# Patient Record
Sex: Male | Born: 1960 | Hispanic: Yes | Marital: Married | Smoking: Current some day smoker
Health system: Southern US, Community
[De-identification: ages and names within clinical notes are randomized; demographics above are authoritative.]

## PROBLEM LIST (undated history)

## (undated) DIAGNOSIS — D696 Thrombocytopenia, unspecified: Secondary | ICD-10-CM

## (undated) DIAGNOSIS — K7460 Unspecified cirrhosis of liver: Secondary | ICD-10-CM

---

## 2016-02-29 ENCOUNTER — Encounter: Payer: Self-pay | Admitting: Emergency Medicine

## 2016-02-29 ENCOUNTER — Emergency Department
Admission: EM | Admit: 2016-02-29 | Discharge: 2016-03-01 | Disposition: A | Discharge status: Home / Self Care | Payer: Self-pay | Attending: Emergency Medicine | Admitting: Emergency Medicine

## 2016-02-29 DIAGNOSIS — F17210 Nicotine dependence, cigarettes, uncomplicated: Secondary | ICD-10-CM | POA: Insufficient documentation

## 2016-02-29 DIAGNOSIS — F10920 Alcohol use, unspecified with intoxication, uncomplicated: Secondary | ICD-10-CM

## 2016-02-29 DIAGNOSIS — F1010 Alcohol abuse, uncomplicated: Secondary | ICD-10-CM | POA: Insufficient documentation

## 2016-02-29 DIAGNOSIS — R109 Unspecified abdominal pain: Secondary | ICD-10-CM | POA: Insufficient documentation

## 2016-02-29 DIAGNOSIS — K7030 Alcoholic cirrhosis of liver without ascites: Secondary | ICD-10-CM | POA: Insufficient documentation

## 2016-02-29 LAB — COMPREHENSIVE METABOLIC PANEL
ALBUMIN: 3.6 g/dL (ref 3.5–5.0)
ALT: 29 U/L (ref 17–63)
ANION GAP: 9 (ref 5–15)
AST: 88 U/L — ABNORMAL HIGH (ref 15–41)
Alkaline Phosphatase: 146 U/L — ABNORMAL HIGH (ref 38–126)
BILIRUBIN TOTAL: 0.8 mg/dL (ref 0.3–1.2)
BUN: 5 mg/dL — ABNORMAL LOW (ref 6–20)
CO2: 22 mmol/L (ref 22–32)
Calcium: 8.2 mg/dL — ABNORMAL LOW (ref 8.9–10.3)
Chloride: 106 mmol/L (ref 101–111)
Creatinine, Ser: 0.57 mg/dL — ABNORMAL LOW (ref 0.61–1.24)
GFR calc Af Amer: >60 mL/min (ref >60)
GFR calc non Af Amer: >60 mL/min (ref >60)
GLUCOSE: 122 mg/dL — AB (ref 65–99)
POTASSIUM: 3.5 mmol/L (ref 3.5–5.1)
SODIUM: 137 mmol/L (ref 135–145)
TOTAL PROTEIN: 8.8 g/dL — AB (ref 6.5–8.1)

## 2016-02-29 LAB — CBC
HCT: 42.3 % (ref 40.0–52.0)
Hemoglobin: 14.9 g/dL (ref 13.0–18.0)
MCH: 33.2 pg (ref 26.0–34.0)
MCHC: 35.1 g/dL (ref 32.0–36.0)
MCV: 94.5 fL (ref 80.0–100.0)
PLATELETS: 208 10*3/uL (ref 150–440)
RBC: 4.48 MIL/uL (ref 4.40–5.90)
RDW: 13.4 % (ref 11.5–14.5)
WBC: 11.4 10*3/uL — ABNORMAL HIGH (ref 3.8–10.6)

## 2016-02-29 LAB — PROTIME-INR
INR: 1.4
Prothrombin Time: 17.3 seconds — ABNORMAL HIGH (ref 11.4–15.0)

## 2016-02-29 NOTE — ED Notes (Signed)
Family reports that patient has been vomiting blood for approximately 2 hours.  Patient with bright red blood noted in mouth.  Denies any type of injury.

## 2016-03-01 ENCOUNTER — Emergency Department: Payer: Self-pay

## 2016-03-01 LAB — ETHANOL: Alcohol, Ethyl (B): 385 mg/dL

## 2016-03-01 MED ORDER — IOPAMIDOL (ISOVUE-300) INJECTION 61%
100.0000 mL | Freq: Once | INTRAVENOUS | Status: AC | PRN
  Administered 2016-03-01: 100 mL via INTRAVENOUS

## 2016-03-01 MED ORDER — DIATRIZOATE MEGLUMINE & SODIUM 66-10 % PO SOLN
15.0000 mL | Freq: Once | ORAL | Status: AC
  Administered 2016-03-01: 15 mL via ORAL

## 2016-03-01 NOTE — Discharge Instructions (Signed)
Alcohol Intoxication Alcohol intoxication occurs when the amount of alcohol that a person has consumed impairs his or her ability to mentally and physically function. Alcohol directly impairs the normal chemical activity of the brain. Drinking large amounts of alcohol can lead to changes in mental function and behavior, and it can cause many physical effects that can be harmful.  Alcohol intoxication can range in severity from mild to very severe. Various factors can affect the level of intoxication that occurs, such as the person's age, gender, weight, frequency of alcohol consumption, and the presence of other medical conditions (such as diabetes, seizures, or heart conditions). Dangerous levels of alcohol intoxication may occur when people drink large amounts of alcohol in a short period (binge drinking). Alcohol can also be especially dangerous when combined with certain prescription medicines or "recreational" drugs. SIGNS AND SYMPTOMS Some common signs and symptoms of mild alcohol intoxication include:  Loss of coordination.  Changes in mood and behavior.  Impaired judgment.  Slurred speech. As alcohol intoxication progresses to more severe levels, other signs and symptoms will appear. These may include:  Vomiting.  Confusion and impaired memory.  Slowed breathing.  Seizures.  Loss of consciousness. DIAGNOSIS  Your health care provider will take a medical history and perform a physical exam. You will be asked about the amount and type of alcohol you have consumed. Blood tests will be done to measure the concentration of alcohol in your blood. In many places, your blood alcohol level must be lower than 80 mg/dL (0.08%) to legally drive. However, many dangerous effects of alcohol can occur at much lower levels.  TREATMENT  People with alcohol intoxication often do not require treatment. Most of the effects of alcohol intoxication are temporary, and they go away as the alcohol naturally  leaves the body. Your health care provider will monitor your condition until you are stable enough to go home. Fluids are sometimes given through an IV access tube to help prevent dehydration.  HOME CARE INSTRUCTIONS  Do not drive after drinking alcohol.  Stay hydrated. Drink enough water and fluids to keep your urine clear or pale yellow. Avoid caffeine.   Only take over-the-counter or prescription medicines as directed by your health care provider.  SEEK MEDICAL CARE IF:   You have persistent vomiting.   You do not feel better after a few days.  You have frequent alcohol intoxication. Your health care provider can help determine if you should see a substance use treatment counselor. SEEK IMMEDIATE MEDICAL CARE IF:   You become shaky or tremble when you try to stop drinking.   You shake uncontrollably (seizure).   You throw up (vomit) blood. This may be bright red or may look like black coffee grounds.   You have blood in your stool. This may be bright red or may appear as a black, tarry, bad smelling stool.   You become lightheaded or faint.  MAKE SURE YOU:   Understand these instructions.  Will watch your condition.  Will get help right away if you are not doing well or get worse.   This information is not intended to replace advice given to you by your health care provider. Make sure you discuss any questions you have with your health care provider.   Document Released: 05/12/2005 Document Revised: 04/04/2013 Document Reviewed: 01/05/2013 Elsevier Interactive Patient Education 2016 [employer].  Cirrhosis Cirrhosis is long-term (chronic) liver injury. The liver is your largest internal organ, and it performs many functions. The liver  converts food into energy, removes toxic material from your blood, makes important proteins, and absorbs necessary vitamins from your diet. If you have cirrhosis, it means many of your healthy liver cells have been replaced by scar  tissue. This prevents blood from flowing through your liver, which makes it difficult for your liver to function. This scarring is not reversible, but treatment can prevent it from getting worse.  CAUSES  Hepatitis C and long-term alcohol abuse are the most common causes of cirrhosis. Other causes include:  Nonalcoholic fatty liver disease.  Hepatitis B infection.  Autoimmune hepatitis.  Diseases that cause blockage of ducts inside the liver.  Inherited liver diseases.  Reactions to certain long-term medicines.  Parasitic infections.  Long-term exposure to certain toxins. RISK FACTORS You may have a higher risk of cirrhosis if you:  Have certain hepatitis viruses.  Abuse alcohol, especially if you are male.  Are overweight.  Share needles.  Have unprotected sex with someone who has hepatitis. SYMPTOMS  You may not have any signs and symptoms at first. Symptoms may not develop until the damage to your liver starts to get worse. Signs and symptoms of cirrhosis may include:   Tenderness in the right-upper part of your abdomen.  Weakness and tiredness (fatigue).  Loss of appetite.  Nausea.  Weight loss and muscle loss.  Itchiness.  Yellow skin and eyes (jaundice).  Buildup of fluid in the abdomen (ascites).  Swelling of the feet and ankles (edema).  Appearance of tiny blood vessels under the skin.  Mental confusion.  Easy bruising and bleeding. DIAGNOSIS  Your health care provider may suspect cirrhosis based on your symptoms and medical history, especially if you have other medical conditions or a history of alcohol abuse. Your health care provider will do a physical exam to feel your liver and check for signs of cirrhosis. Your health care provider may perform other tests, including:   Blood tests to check:   Whether you have hepatitis B or C.   Kidney function.  Liver function.  Imaging tests such as:  MRI or CT scan to look for changes seen in  advanced cirrhosis.  Ultrasound to see if normal liver tissue is being replaced by scar tissue.  A procedure using a long needle to take a sample of liver tissue (biopsy) for examination under a microscope. Liver biopsy can confirm the diagnosis of cirrhosis.  TREATMENT  Treatment depends on how damaged your liver is and what caused the damage. Treatment may include treating cirrhosis symptoms or treating the underlying causes of the condition to try to slow the progression of the damage. Treatment may include:  Making lifestyle changes, such as:   Eating a healthy diet.  Restricting salt intake.  Maintaining a healthy weight.   Not abusing drugs or alcohol.  Taking medicines to:  Treat liver infections or other infections.  Control itching.  Reduce fluid buildup.  Reduce certain blood toxins.  Reduce risk of bleeding from enlarged blood vessels in the stomach or esophagus (varices).  If varices are causing bleeding problems, you may need treatment with a procedure that ties up the vessels causing them to fall off (band ligation).  If cirrhosis is causing your liver to fail, your health care provider may recommend a liver transplant.  Other treatments may be recommended depending on any complications of cirrhosis, such as liver-related kidney failure (hepatorenal syndrome). HOME CARE INSTRUCTIONS   Take medicines only as directed by your health care provider. Do not use drugs  that are toxic to your liver. Ask your health care provider before taking any new medicines, including over-the-counter medicines.   Rest as needed.  Eat a well-balanced diet. Ask your health care provider or dietitian for more information.   You may have to follow a low-salt diet or restrict your water intake as directed.  Do not drink alcohol. This is especially important if you are taking acetaminophen.  Keep all follow-up visits as directed by your health care provider. This is  important. SEEK MEDICAL CARE IF:  You have fatigue or weakness that is getting worse.  You develop swelling of the hands, feet, legs, or face.  You have a fever.  You develop loss of appetite.  You have nausea or vomiting.  You develop jaundice.  You develop easy bruising or bleeding. SEEK IMMEDIATE MEDICAL CARE IF:  You vomit bright red blood or a material that looks like coffee grounds.  You have blood in your stools.  Your stools appear black and tarry.  You become confused.  You have chest pain or trouble breathing.   This information is not intended to replace advice given to you by your health care provider. Make sure you discuss any questions you have with your health care provider.   Document Released: 08/02/2005 Document Revised: 08/23/2014 Document Reviewed: 04/10/2014 Elsevier Interactive Patient Education [employer].

## 2016-03-01 NOTE — ED Provider Notes (Addendum)
[facility] Emergency Department Provider Note  ____________________________________________  Time seen: 11:50 PM  I have reviewed the triage vital signs and the nursing notes.   HISTORY  Chief Complaint Hematemesis     HPI Timothy Goodman is a 55 y.o. male presents with 3 episodes of hematemesis this evening. Patient admits to daily EtOH intake today the patient states that he had  Three 40 ounce of beer. Patient denies any known liver disease. Patient denies any previous episodes of hematemesis. Patient denies any abdominal pain at this time    Past medical history None There are no active problems to display for this patient.   History reviewed. No pertinent past surgical history.  No current outpatient prescriptions on file.  Allergies No known drug allergies No family history on file.  Social History Social History  Substance Use Topics  . Smoking status: Current Some Day Smoker -- 0.00 packs/day    Types: Cigarettes  . Smokeless tobacco: None  . Alcohol Use: 0.6 oz/week    1 Cans of beer per week    Review of Systems  Constitutional: Negative for fever. Eyes: Negative for visual changes. ENT: Negative for sore throat. Cardiovascular: Negative for chest pain. Respiratory: Negative for shortness of breath. Gastrointestinal: Negative for abdominal pain, vomiting and diarrhea.Positive for vomiting blood Genitourinary: Negative for dysuria. Musculoskeletal: Negative for back pain. Skin: Negative for rash. Neurological: Negative for headaches, focal weakness or numbness.   10-point ROS otherwise negative.  ____________________________________________   PHYSICAL EXAM:  VITAL SIGNS: ED Triage Vitals  Enc Vitals Group     BP 02/29/16 2330 124/81 mmHg     Pulse Rate 02/29/16 2330 102     Resp 02/29/16 2330 21     Temp --      Temp src --      SpO2 02/29/16 2330 92 %     Weight --      Height --      Head Cir --      Peak  Flow --      Pain Score 02/29/16 2350 0     Pain Loc --      Pain Edu? --      Excl. in [place]? --      Constitutional: Alert and oriented. Well appearing and in no distress.EtOH on breath Eyes: Conjunctivae are normal. PERRL. Normal extraocular movements. ENT   Head: Normocephalic and atraumatic.   Nose: No congestion/rhinnorhea.   Mouth/Throat: Mucous membranes are moist. Blood noted in the mouth   Neck: No stridor. Hematological/Lymphatic/Immunilogical: No cervical lymphadenopathy. Cardiovascular: Normal rate, regular rhythm. Normal and symmetric distal pulses are present in all extremities. No murmurs, rubs, or gallops. Respiratory: Normal respiratory effort without tachypnea nor retractions. Breath sounds are clear and equal bilaterally. No wheezes/rales/rhonchi. Gastrointestinal: Soft and nontender. No distention. There is no CVA tenderness. Genitourinary: deferred Musculoskeletal: Nontender with normal range of motion in all extremities. No joint effusions.  No lower extremity tenderness nor edema. Neurologic:  Normal speech and language. No gross focal neurologic deficits are appreciated. Speech is normal.  Skin:  Skin is warm, dry and intact. No rash noted.Patient has a actively bleeding cut on his upper lip Psychiatric: Mood and affect are normal. Speech and behavior are normal. Patient exhibits appropriate insight and judgment.  ____________________________________________    LABS (pertinent positives/negatives)  Labs Reviewed  CBC - Abnormal; Notable for the following:    WBC 11.4 (*)    All other components within normal limits  COMPREHENSIVE METABOLIC PANEL - Abnormal; Notable for the following:    Glucose, Bld 122 (*)    BUN 5 (*)    Creatinine, Ser 0.57 (*)    Calcium 8.2 (*)    Total Protein 8.8 (*)    AST 88 (*)    Alkaline Phosphatase 146 (*)    All other components within normal limits  PROTIME-INR - Abnormal; Notable for the following:     Prothrombin Time 17.3 (*)    All other components within normal limits  ETHANOL - Abnormal; Notable for the following:    Alcohol, Ethyl (B) 385 (*)    All other components within normal limits      RADIOLOGY  CT Abdomen Pelvis W Contrast (Final result) Result time: 03/01/16 02:24:02   Final result by Rad Results In Interface (03/01/16 02:24:02)   Narrative:   CLINICAL DATA: Abdominal pain and hematemesis.  EXAM: CT ABDOMEN AND PELVIS WITH CONTRAST  TECHNIQUE: Multidetector CT imaging of the abdomen and pelvis was performed using the standard protocol following bolus administration of intravenous contrast.  CONTRAST: 165mL ISOVUE-300 IOPAMIDOL (ISOVUE-300) INJECTION 61%  COMPARISON: None.  FINDINGS: Lower chest and abdominal wall: Tiny fatty umbilical hernia  Hepatobiliary: Cirrhotic liver morphology with patchy steatosis. No focal mass lesion is seen. No varices, splenomegaly, or ascites is seen.No evidence of biliary obstruction or stone.  Pancreas: Unremarkable.  Spleen: Unremarkable.  Adrenals/Urinary Tract: Negative adrenals. No hydronephrosis. Symmetric renal enhancement. Nonobstructive 5 mm left renal calculus. Unremarkable bladder.  Stomach/Bowel: No obstruction. No appendicitis.  Reproductive:No pathologic findings.  Vascular/Lymphatic: Aortic atherosclerosis. No acute vascular abnormality. No mass or adenopathy.  Other: No ascites or pneumoperitoneum.  Musculoskeletal: No acute abnormalities. Remote left ninth and tenth rib fractures.  IMPRESSION: 1. No acute finding. 2. Cirrhotic appearance of liver; correlate for risk factors. No associated varix to explain hematemesis history. 3. Left nephrolithiasis. 4. Aortic Atherosclerosis (ICD10-170.0)   Electronically Signed By: Monte Fantasia M.D. On: 03/01/2016 02:24         Procedures    INITIAL IMPRESSION / ASSESSMENT AND PLAN / ED COURSE  Pertinent labs & imaging  results that were available during my care of the patient were reviewed by me and considered in my medical decision making (see chart for details).  Patient markedly intoxicated: [id number] CT scan of the abdomen pelvis revealed liver appearance consistent with cirrhosis however no varices noted to explain hematemesis. Possible of the patient's blood in his mouth secondary to the cut on his lip however he denies any injury that he can recall. I offered admission to the patient which she refused  ____________________________________________   FINAL CLINICAL IMPRESSION(S) / ED DIAGNOSES  Final diagnoses:  Alcoholic cirrhosis of liver without ascites ([place])  Alcohol intoxication, uncomplicated ([facility])      Gregor Hams, MD 03/01/16 [place], MD 03/01/16 [phone]

## 2019-12-15 DIAGNOSIS — C184 Malignant neoplasm of transverse colon: Secondary | ICD-10-CM

## 2019-12-15 HISTORY — DX: Malignant neoplasm of transverse colon: C18.4

## 2019-12-31 ENCOUNTER — Other Ambulatory Visit: Payer: 59 | Attending: Internal Medicine

## 2020-01-01 ENCOUNTER — Encounter: Payer: Self-pay | Admitting: Internal Medicine

## 2020-01-01 ENCOUNTER — Other Ambulatory Visit
Admission: RE | Admit: 2020-01-01 | Discharge: 2020-01-01 | Disposition: A | Discharge status: Home / Self Care | Payer: 59 | Source: Ambulatory Visit | Attending: Internal Medicine | Admitting: Internal Medicine

## 2020-01-01 DIAGNOSIS — Z20822 Contact with and (suspected) exposure to covid-19: Secondary | ICD-10-CM | POA: Diagnosis not present

## 2020-01-01 DIAGNOSIS — Z01812 Encounter for preprocedural laboratory examination: Secondary | ICD-10-CM | POA: Insufficient documentation

## 2020-01-01 LAB — SARS CORONAVIRUS 2 (TAT 6-24 HRS): SARS Coronavirus 2: NEGATIVE

## 2020-01-02 ENCOUNTER — Encounter: Payer: Self-pay | Admitting: Internal Medicine

## 2020-01-02 ENCOUNTER — Encounter
Admission: RE | Disposition: A | Discharge status: Home / Self Care | Payer: Self-pay | Source: Home / Self Care | Attending: Internal Medicine

## 2020-01-02 ENCOUNTER — Ambulatory Visit
Admission: RE | Admit: 2020-01-02 | Discharge: 2020-01-02 | Disposition: A | Discharge status: Home / Self Care | Payer: 59 | Attending: Internal Medicine | Admitting: Internal Medicine

## 2020-01-02 ENCOUNTER — Ambulatory Visit: Payer: 59 | Admitting: Anesthesiology

## 2020-01-02 ENCOUNTER — Other Ambulatory Visit: Payer: Self-pay

## 2020-01-02 DIAGNOSIS — D696 Thrombocytopenia, unspecified: Secondary | ICD-10-CM | POA: Diagnosis not present

## 2020-01-02 DIAGNOSIS — D123 Benign neoplasm of transverse colon: Secondary | ICD-10-CM | POA: Diagnosis not present

## 2020-01-02 DIAGNOSIS — K7030 Alcoholic cirrhosis of liver without ascites: Secondary | ICD-10-CM | POA: Insufficient documentation

## 2020-01-02 DIAGNOSIS — D120 Benign neoplasm of cecum: Secondary | ICD-10-CM | POA: Diagnosis not present

## 2020-01-02 DIAGNOSIS — Z1211 Encounter for screening for malignant neoplasm of colon: Secondary | ICD-10-CM | POA: Insufficient documentation

## 2020-01-02 DIAGNOSIS — K222 Esophageal obstruction: Secondary | ICD-10-CM | POA: Diagnosis not present

## 2020-01-02 DIAGNOSIS — K640 First degree hemorrhoids: Secondary | ICD-10-CM | POA: Diagnosis not present

## 2020-01-02 DIAGNOSIS — F17200 Nicotine dependence, unspecified, uncomplicated: Secondary | ICD-10-CM | POA: Diagnosis not present

## 2020-01-02 DIAGNOSIS — K766 Portal hypertension: Secondary | ICD-10-CM | POA: Diagnosis not present

## 2020-01-02 DIAGNOSIS — C184 Malignant neoplasm of transverse colon: Secondary | ICD-10-CM | POA: Diagnosis not present

## 2020-01-02 DIAGNOSIS — K219 Gastro-esophageal reflux disease without esophagitis: Secondary | ICD-10-CM | POA: Insufficient documentation

## 2020-01-02 DIAGNOSIS — R1310 Dysphagia, unspecified: Secondary | ICD-10-CM | POA: Diagnosis not present

## 2020-01-02 DIAGNOSIS — K3189 Other diseases of stomach and duodenum: Secondary | ICD-10-CM | POA: Diagnosis not present

## 2020-01-02 HISTORY — PX: COLONOSCOPY WITH PROPOFOL: SHX5780

## 2020-01-02 HISTORY — PX: ESOPHAGOGASTRODUODENOSCOPY (EGD) WITH PROPOFOL: SHX5813

## 2020-01-02 SURGERY — COLONOSCOPY WITH PROPOFOL
Anesthesia: General

## 2020-01-02 MED ORDER — SODIUM CHLORIDE 0.9 % IV SOLN: INTRAVENOUS | Status: DC

## 2020-01-02 MED ORDER — PROPOFOL 10 MG/ML IV BOLUS
INTRAVENOUS | Status: AC
  Filled 2020-01-02: qty 20

## 2020-01-02 MED ORDER — PROPOFOL 500 MG/50ML IV EMUL
INTRAVENOUS | Status: DC | PRN
  Administered 2020-01-02: 120 ug/kg/min via INTRAVENOUS

## 2020-01-02 MED ORDER — SPOT INK MARKER SYRINGE KIT
PACK | SUBMUCOSAL | Status: DC | PRN
  Administered 2020-01-02: 9 mL via SUBMUCOSAL

## 2020-01-02 MED ORDER — PROPOFOL 10 MG/ML IV BOLUS
INTRAVENOUS | Status: DC | PRN
  Administered 2020-01-02 (×5): 50 mg via INTRAVENOUS

## 2020-01-02 MED ORDER — LIDOCAINE HCL (CARDIAC) PF 100 MG/5ML IV SOSY
PREFILLED_SYRINGE | INTRAVENOUS | Status: DC | PRN
  Administered 2020-01-02: 80 mg via INTRAVENOUS

## 2020-01-02 NOTE — Progress Notes (Signed)
Post procedure and discharge instructions were provided to the patient with the use of a Spanish speaking interpreter. Patient verbalized understanding and is agreeable to all instructions provided.

## 2020-01-02 NOTE — Anesthesia Postprocedure Evaluation (Signed)
Anesthesia Post Note  Patient: Timothy Goodman  Procedure(s) Performed: COLONOSCOPY WITH PROPOFOL (N/A ) ESOPHAGOGASTRODUODENOSCOPY (EGD) WITH PROPOFOL (N/A )  Patient location during evaluation: Endoscopy Anesthesia Type: General Level of consciousness: awake and alert and oriented Pain management: pain level controlled Vital Signs Assessment: post-procedure vital signs reviewed and stable Respiratory status: spontaneous breathing, nonlabored ventilation and respiratory function stable Cardiovascular status: blood pressure returned to baseline and stable Postop Assessment: no signs of nausea or vomiting Anesthetic complications: no     Last Vitals:  Vitals:   01/02/20 1355 01/02/20 1437  BP: (!) 164/95 (!) 123/59  Pulse: 97   Resp: 18   Temp: 36.4 C   SpO2: 99%     Last Pain:  Vitals:   01/02/20 1507  TempSrc:   PainSc: 0-No pain                 Amy [place]warden

## 2020-01-02 NOTE — Anesthesia Preprocedure Evaluation (Addendum)
Anesthesia Evaluation  Patient identified by MRN, date of birth, ID band Patient awake    Reviewed: Allergy & Precautions, H&P , NPO status , Patient's Chart, lab work & pertinent test results  History of Anesthesia Complications Negative for: history of anesthetic complications  Airway Mallampati: III  TM Distance: >3 FB     Dental  (+) Poor Dentition   Pulmonary neg shortness of breath, neg sleep apnea, neg COPD, neg recent URI, Current Smoker,   Clubbing of fingers noted   + decreased breath sounds      Cardiovascular (-) angina(-) Past MI negative cardio ROS  (-) dysrhythmias  Rhythm:regular Rate:Normal     Neuro/Psych negative neurological ROS  negative psych ROS   GI/Hepatic (+) Cirrhosis     substance abuse  alcohol use, Alcoholic cirrhosis Occasional dysphagia, denies any sensation of food stuck currently   Endo/Other  negative endocrine ROS  Renal/GU negative Renal ROS     Musculoskeletal   Abdominal   Peds  Hematology negative hematology ROS (+)   Anesthesia Other Findings   Reproductive/Obstetrics negative OB ROS                            Anesthesia Physical Anesthesia Plan  ASA: III  Anesthesia Plan: General   Post-op Pain Management:    Induction:   PONV Risk Score and Plan: Propofol infusion and TIVA  Airway Management Planned: Nasal Cannula and Natural Airway  Additional Equipment:   Intra-op Plan:   Post-operative Plan:   Informed Consent: I have reviewed the patients History and Physical, chart, labs and discussed the procedure including the risks, benefits and alternatives for the proposed anesthesia with the patient or authorized representative who has indicated his/her understanding and acceptance.       Plan Discussed with: Anesthesiologist  Anesthesia Plan Comments:        Anesthesia Quick Evaluation

## 2020-01-02 NOTE — Interval H&P Note (Signed)
History and Physical Interval Note:  01/02/2020 1:15 PM  Timothy Goodman  has presented today for surgery, with the diagnosis of ESOPHAGEAL DYSPHAGIA ALCOHOLIC CIRRHOSIS OF LIVER SCREENING.  The various methods of treatment have been discussed with the patient and family. After consideration of risks, benefits and other options for treatment, the patient has consented to  Procedure(s): COLONOSCOPY WITH PROPOFOL (N/A) ESOPHAGOGASTRODUODENOSCOPY (EGD) WITH PROPOFOL (N/A) as a surgical intervention.  The patient's history has been reviewed, patient examined, no change in status, stable for surgery.  I have reviewed the patient's chart and labs.  Questions were answered to the patient's satisfaction.     [place], [place]

## 2020-01-02 NOTE — H&P (Signed)
Outpatient short stay form Pre-procedure 01/02/2020 11:11 AM Timothy Goodman [name], M.D.  Primary Physician: Eda Paschal, NP  Reason for visit:  GERD, dysphagia, colon cancer screening, alcoholic cirrhosis.  History of present illness: AS above. Patient has dysphagia to solids, mild thrombocytopenia. Memory loss. CT of the abd/pelvis showed coarsened echotexture of the liver compatible with cirrhosis. No varices were noted. Steak and pork can get stuck at the upper sternum.  Patient presents for colonoscopy for colon cancer screening. The patient denies complaints of abdominal pain, significant change in bowel habits, or rectal bleeding.     No current facility-administered medications for this encounter.  Current Outpatient Medications:  .  lactulose (CHRONULAC) 10 GM/15ML solution, Take 10 g by mouth 2 (two) times daily., Disp: , Rfl:  .  Multiple Vitamin (MULTIVITAMIN WITH MINERALS) TABS tablet, Take 1 tablet by mouth daily., Disp: , Rfl:   No medications prior to admission.     No Known Allergies   No past medical history on file.  Review of systems:  Otherwise negative.    Physical Exam  Gen: Alert, oriented. Appears stated age.  HEENT: /AT. PERRLA. Lungs: CTA, no wheezes. CV: RR nl S1, S2. Abd: soft, benign, no masses. BS+ Ext: No edema. Pulses 2+    Planned procedures: Proceed with EGD and colonoscopy. The patient understands the nature of the planned procedure, indications, risks, alternatives and potential complications including but not limited to bleeding, infection, perforation, damage to internal organs and possible oversedation/side effects from anesthesia. The patient agrees and gives consent to proceed.  Please refer to procedure notes for findings, recommendations and patient disposition/instructions.     Timothy Goodman [name], M.D. Gastroenterology 01/02/2020  11:11 AM

## 2020-01-02 NOTE — Transfer of Care (Signed)
Immediate Anesthesia Transfer of Care Note  Patient: Timothy Goodman  Procedure(s) Performed: COLONOSCOPY WITH PROPOFOL (N/A ) ESOPHAGOGASTRODUODENOSCOPY (EGD) WITH PROPOFOL (N/A )  Patient Location: PACU and Endoscopy Unit  Anesthesia Type:General  Level of Consciousness: sedated  Airway & Oxygen Therapy: Patient Spontanous Breathing  Post-op Assessment: Report given to RN  Post vital signs: stable  Last Vitals:  Vitals Value Taken Time  BP    Temp    Pulse    Resp    SpO2      Last Pain:  Vitals:   01/02/20 1355  TempSrc: Temporal  PainSc: 0-No pain         Complications: No apparent anesthesia complications

## 2020-01-02 NOTE — Interval H&P Note (Signed)
History and Physical Interval Note:  01/02/2020 1:15 PM  Timothy Goodman  has presented today for surgery, with the diagnosis of ESOPHAGEAL DYSPHAGIA ALCOHOLIC CIRRHOSIS OF LIVER SCREENING.  The various methods of treatment have been discussed with the patient and family. After consideration of risks, benefits and other options for treatment, the patient has consented to  Procedure(s): COLONOSCOPY WITH PROPOFOL (N/A) ESOPHAGOGASTRODUODENOSCOPY (EGD) WITH PROPOFOL (N/A) as a surgical intervention.  The patient's history has been reviewed, patient examined, no change in status, stable for surgery.  I have reviewed the patient's chart and labs.  Questions were answered to the patient's satisfaction.     [place], [place]

## 2020-01-02 NOTE — Op Note (Signed)
[facility] Patient Name: Timothy Goodman Procedure Date: 01/02/2020 1:58 PM MRN: [id number] Account #: [id number] Date of Birth: 1961-05-26 Admit Type: Outpatient Age: 59 Room: [facility] ENDO ROOM 3 Gender: Male Note Status: Finalized Procedure:             Upper GI endoscopy Indications:           Dysphagia, Gastro-esophageal reflux disease, Variceal                         screening (no known varices or prior bleeding) Providers:             Benay Pike. Eriyah Fernando MD, MD Medicines:             Propofol per Anesthesia Complications:         No immediate complications. Procedure:             Pre-Anesthesia Assessment:                        - The risks and benefits of the procedure and the                         sedation options and risks were discussed with the                         patient. All questions were answered and informed                         consent was obtained.                        - Patient identification and proposed procedure were                         verified prior to the procedure by the nurse. The                         procedure was verified in the procedure room.                        - ASA Grade Assessment: III - A patient with severe                         systemic disease.                        - After reviewing the risks and benefits, the patient                         was deemed in satisfactory condition to undergo the                         procedure.                        After obtaining informed consent, the endoscope was                         passed under direct vision. Throughout the procedure,  the patient's blood pressure, pulse, and oxygen                         saturations were monitored continuously. The Endoscope                         was introduced through the mouth, and advanced to the                         third part of duodenum. The upper GI endoscopy was                          accomplished without difficulty. The patient tolerated                         the procedure well. Findings:      One benign-appearing, intrinsic mild stenosis was found in the distal       esophagus. This stenosis measured 1.5 cm (inner diameter) x less than       one cm (in length). The stenosis was traversed. The scope was withdrawn.       Dilation was performed with a Maloney dilator with no resistance at 23       Fr.      There is no endoscopic evidence of Barrett's esophagus or varices in the       lower third of the esophagus.      Moderate portal hypertensive gastropathy was found in the entire       examined stomach.      There is no endoscopic evidence of varices in the cardia and in the       gastric fundus.      The examined duodenum was normal.      The exam was otherwise without abnormality.      LA Grade B (one or more mucosal breaks greater than 5 mm, not extending       between the tops of two mucosal folds) esophagitis with no bleeding was       found in the distal esophagus. Impression:            - Benign-appearing esophageal stenosis. Dilated.                        - Portal hypertensive gastropathy.                        - Normal examined duodenum.                        - The examination was otherwise normal.                        - No specimens collected. Recommendation:        - Monitor results to esophageal dilation                        - Repeat upper endoscopy in 2 years for screening                         purposes.                        -  Proceed with colonoscopy Procedure Code(s):     --- Professional ---                        [phone], Esophagogastroduodenoscopy, flexible,                         transoral; diagnostic, including collection of                         specimen(s) by brushing or washing, when performed                         (separate procedure)                        43450, Dilation of esophagus, by unguided sound  or                         bougie, single or multiple passes Diagnosis Code(s):     --- Professional ---                        Z13.810, Encounter for screening for upper                         gastrointestinal disorder                        R13.10, Dysphagia, unspecified                        K31.89, Other diseases of stomach and duodenum                        K76.6, Portal hypertension                        K22.2, Esophageal obstruction CPT copyright 2019 American Medical Association. All rights reserved. The codes documented in this report are preliminary and upon coder review may  be revised to meet current compliance requirements. Efrain Sella MD, MD 01/02/2020 2:09:53 PM This report has been signed electronically. Number of Addenda: 0 Note Initiated On: 01/02/2020 1:58 PM Estimated Blood Loss:  Estimated blood loss: none. Estimated blood loss: none.      [facility]

## 2020-01-02 NOTE — Op Note (Signed)
[facility] Patient Name: Travion Frymire Procedure Date: 01/02/2020 1:58 PM MRN: [id number] Account #: [id number] Date of Birth: 1961/02/02 Admit Type: Outpatient Age: 59 Room: [facility] ENDO ROOM 3 Gender: Male Note Status: Finalized Procedure:             Colonoscopy Indications:           Screening for colorectal malignant neoplasm Providers:             Benay Pike. Mithran Strike MD, MD Medicines:             Propofol per Anesthesia Complications:         No immediate complications. Estimated blood loss: None. Procedure:             Pre-Anesthesia Assessment:                        - The risks and benefits of the procedure and the                         sedation options and risks were discussed with the                         patient. All questions were answered and informed                         consent was obtained.                        - Patient identification and proposed procedure were                         verified prior to the procedure by the nurse. The                         procedure was verified in the procedure room.                        - ASA Grade Assessment: III - A patient with severe                         systemic disease.                        - After reviewing the risks and benefits, the patient                         was deemed in satisfactory condition to undergo the                         procedure.                        After obtaining informed consent, the colonoscope was                         passed under direct vision. Throughout the procedure,                         the patient's blood pressure, pulse, and oxygen  saturations were monitored continuously. The                         Colonoscope was introduced through the anus and                         advanced to the the cecum, identified by appendiceal                         orifice and ileocecal valve. The colonoscopy was                          performed without difficulty. The patient tolerated                         the procedure well. The quality of the bowel                         preparation was fair. Findings:      The perianal and digital rectal examinations were normal. Pertinent       negatives include normal sphincter tone and no palpable rectal lesions.      A 5 mm polyp was found in the proximal transverse colon. The polyp was       semi-pedunculated. The polyp was removed with a cold snare. Resection       and retrieval were complete.      A continuous area of nonbleeding ulcerated mucosa with no stigmata of       recent bleeding was present in the proximal transverse colon. Biopsies       were taken with a cold forceps for histology. Area was tattooed with an       injection of spot carbon black, 2cc around the lesion, 1cc in each of       four quadrants proximal. 1cc of spot carbon also injected in 3 of 4       quadrants distal to the lesion. Estimated blood loss: none.      A moderate amount of stool was found in the entire colon, making       visualization difficult. Lavage of the area was performed using a       moderate amount of tap water, resulting in clearance with fair       visualization.      Non-bleeding internal hemorrhoids were found during retroflexion. The       hemorrhoids were Grade I (internal hemorrhoids that do not prolapse).      A 7 mm polyp was found in the cecum. The polyp was sessile. The polyp       was removed with a jumbo cold forceps. Resection and retrieval were       complete. Impression:            - Preparation of the colon was fair.                        - One 5 mm polyp in the proximal transverse colon,                         removed with a cold snare. Resected and retrieved.                        -  Mucosal ulceration. Biopsied. Tattooed.                        - Stool in the entire examined colon. Recommendation:        - Patient has a contact number  available for                         emergencies. The signs and symptoms of potential                         delayed complications were discussed with the patient.                         Return to normal activities tomorrow. Written                         discharge instructions were provided to the patient.                        - Resume previous diet.                        - Continue present medications.                        - Monitor results to esophageal dilation                        - Repeat colonoscopy is recommended for surveillance.                         The colonoscopy date will be determined after                         pathology results from today's exam become available                         for review.                        - Return to GI office in 1 year.                        - The findings and recommendations were discussed with                         the patient. Procedure Code(s):     --- Professional ---                        [phone], Colonoscopy, flexible; with removal of                         tumor(s), polyp(s), or other lesion(s) by snare                         technique                        45381, Colonoscopy, flexible; with directed submucosal  injection(s), any substance                        45380, 59, Colonoscopy, flexible; with biopsy, single                         or multiple Diagnosis Code(s):     --- Professional ---                        K63.3, Ulcer of intestine                        K63.5, Polyp of colon                        Z12.11, Encounter for screening for malignant neoplasm                         of colon CPT copyright 2019 American Medical Association. All rights reserved. The codes documented in this report are preliminary and upon coder review may  be revised to meet current compliance requirements. Efrain Sella MD, MD 01/02/2020 2:41:12 PM This report has been signed  electronically. Number of Addenda: 0 Note Initiated On: 01/02/2020 1:58 PM Scope Withdrawal Time: 0 hours 19 minutes 3 seconds  Total Procedure Duration: 0 hours 21 minutes 13 seconds  Estimated Blood Loss:  Estimated blood loss: none. Estimated blood loss:                         none. Estimated blood loss: none.      [facility]

## 2020-01-03 ENCOUNTER — Encounter: Payer: Self-pay | Admitting: *Deleted

## 2020-01-04 ENCOUNTER — Other Ambulatory Visit: Payer: Self-pay | Admitting: Pathology

## 2020-01-04 LAB — SURGICAL PATHOLOGY

## 2020-01-08 ENCOUNTER — Other Ambulatory Visit: Payer: Self-pay | Admitting: Gastroenterology

## 2020-01-08 ENCOUNTER — Ambulatory Visit
Admission: RE | Admit: 2020-01-08 | Discharge: 2020-01-08 | Disposition: A | Discharge status: Home / Self Care | Payer: 59 | Source: Ambulatory Visit | Attending: Gastroenterology | Admitting: Gastroenterology

## 2020-01-08 ENCOUNTER — Other Ambulatory Visit: Payer: Self-pay

## 2020-01-08 DIAGNOSIS — C184 Malignant neoplasm of transverse colon: Secondary | ICD-10-CM

## 2020-01-08 LAB — POCT I-STAT CREATININE: Creatinine, Ser: 0.8 mg/dL (ref 0.61–1.24)

## 2020-01-08 MED ORDER — IOHEXOL 300 MG/ML  SOLN
100.0000 mL | Freq: Once | INTRAMUSCULAR | Status: AC | PRN
  Administered 2020-01-08: 100 mL via INTRAVENOUS

## 2020-01-10 ENCOUNTER — Ambulatory Visit: Payer: Self-pay | Admitting: General Surgery

## 2020-01-10 ENCOUNTER — Other Ambulatory Visit: Payer: 59

## 2020-01-10 NOTE — H&P (View-Only) (Signed)
PATIENT PROFILE: Timothy Goodman is a 59 y.o. male who presents to the Clinic for consultation at the request of Timothy Goodman for evaluation of colon cancer.  PCP:  None  HISTORY OF PRESENT ILLNESS: Timothy Goodman reports he had endoscopy and colonoscopy for evaluation of liver disease.  On the colonoscopy he was found with transverse colon ulcerated lesion.  Patient denies any change in bowel habits.  He denies weight loss.  He denies rectal bleeding.  He denies abdominal pain.  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  I personally evaluated the images of the colonoscopy and the report.  Patient has CT of the chest abdominal pelvis and there is no evidence of distant disease.  He was found with cirrhosis of the liver with compensated liver function.  Today the platelet shows 121,000 which is improved from 90,000 22-month ago.   PROBLEM LIST:     Problem List  Date Reviewed: 05/03/2018   None      GENERAL REVIEW OF SYSTEMS:   General ROS: negative for - chills, fatigue, fever, weight gain or weight loss Allergy and Immunology ROS: negative for - hives  Hematological and Lymphatic ROS: negative for - bleeding problems or bruising, negative for palpable nodes Endocrine ROS: negative for - heat or cold intolerance, hair changes Respiratory ROS: negative for - cough, shortness of breath or wheezing Cardiovascular ROS: no chest pain or palpitations GI ROS: negative for nausea, vomiting, abdominal pain, diarrhea, constipation Musculoskeletal ROS: negative for - joint swelling or muscle pain Neurological ROS: negative for - confusion, syncope Dermatological ROS: negative for pruritus and rash Psychiatric: negative for anxiety, depression, difficulty sleeping and memory loss  MEDICATIONS: Current Medications        Current Outpatient Medications  Medication Sig Dispense Refill  . mv-mn/folic [id number] (MEGA MULTIVITAMIN FOR MEN ORAL)  Take by mouth once daily [facility]    . metroNIDAZOLE (FLAGYL) 500 MG tablet Take 2 tablets at 2 pm, 3 pm and 10 pm the day before surgery. 6 tablet 0  . multivitamin with minerals tablet Take by mouth (Patient not taking: Reported on 01/10/2020  )    . neomycin 500 mg tablet Take 2 tablets at 2 pm, 3 pm and 10 pm the day before surgery. 6 tablet 0   No current facility-administered medications for this visit.      ALLERGIES: Patient has no known allergies.  PAST MEDICAL HISTORY: No past medical history on file.  PAST SURGICAL HISTORY:      Past Surgical History:  Procedure Laterality Date  . COLONOSCOPY  01/02/2020   Invasive colorectal adenocarcinoma, Well-differentiated  . EGD  01/02/2020   Esophageal stenosis.Dilated     FAMILY HISTORY:      Family History  Problem Relation Age of Onset  . Colon cancer Neg Hx      SOCIAL HISTORY: Social History          Socioeconomic History  . Marital status: Married    Spouse name: Not on file  . Number of children: Not on file  . Years of education: Not on file  . Highest education level: Not on file  Occupational History  . Not on file  Tobacco Use  . Smoking status: Current Some Day Smoker  . Smokeless tobacco: Never Used  Substance and Sexual Activity  . Alcohol use: Yes    Comment: Every weekend -   . Drug use: Not Currently  . Sexual activity: Not on file  Other Topics Concern  .  Not on file  Social History Narrative  . Not on file   Social Determinants of Health      Financial Resource Strain:   . Difficulty of Paying Living Expenses:   Food Insecurity:   . Worried About Charity fundraiser in the Last Year:   . Arboriculturist in the Last Year:   Transportation Needs:   . Film/video editor (Medical):   Marland Kitchen Lack of Transportation (Non-Medical):       PHYSICAL EXAM:    Vitals:   01/10/20 1057  BP: 130/80  Pulse: 92   Body mass index is 30.82 kg/m. Weight: 78.9 kg (174  lb)   GENERAL: Alert, active, oriented x3  HEENT: Pupils equal reactive to light. Extraocular movements are intact. Sclera clear. Palpebral conjunctiva normal red color.Pharynx clear.  NECK: Supple with no palpable mass and no adenopathy.  LUNGS: Sound clear with no rales rhonchi or wheezes.  HEART: Regular rhythm S1 and S2 without murmur.  ABDOMEN: Soft and depressible, nontender with no palpable mass, no hepatomegaly.   EXTREMITIES: Well-developed well-nourished symmetrical with no dependent edema.  NEUROLOGICAL: Awake alert oriented, facial expression symmetrical, moving all extremities.  REVIEW OF DATA: I have reviewed the following data today:      Initial consult on 01/10/2020  Component Date Value  . WBC (White Blood Cell Co* 01/10/2020 9.3   . RBC (Red Blood Cell Coun* 01/10/2020 5.11   . Hemoglobin 01/10/2020 16.9   . Hematocrit 01/10/2020 48.6   . MCV (Mean Corpuscular Vo* 01/10/2020 95.1   . MCH (Mean Corpuscular He* 01/10/2020 33.1*  . MCHC (Mean Corpuscular H* 01/10/2020 34.8   . Platelet Count 01/10/2020 121*  . RDW-CV (Red Cell Distrib* 01/10/2020 13.2   . MPV (Mean Platelet Volum* 01/10/2020 11.2   . Neutrophils 01/10/2020 6.32   . Lymphocytes 01/10/2020 1.82   . Monocytes 01/10/2020 0.85   . Eosinophils 01/10/2020 0.25   . Basophils 01/10/2020 0.06   . Neutrophil % 01/10/2020 67.9   . Lymphocyte % 01/10/2020 19.5   . Monocyte % 01/10/2020 9.1   . Eosinophil % 01/10/2020 2.7   . Basophil% 01/10/2020 0.6   . Immature Granulocyte % 01/10/2020 0.2   . Immature Granulocyte Cou* 01/10/2020 0.02      ASSESSMENT: Timothy Goodman is a 59 y.o. male presenting for consultation for transverse colon cancer.  Patient with ulcerated lesion in the proximal transverse colon positive for adenocarcinoma of the colon, well-differentiated.  The patient was oriented about the pathology results.  He was oriented about the surgical management that includes right  extended hemicolectomy.  I discussed with the patient the goal of the surgery that will be removed the cancer and the lymph nodes for staging.  I also discussed with the patient the risk of surgery that includes bleeding, infection, leak from anastomosis, abscess, bowel obstruction, fistulas, DVT, cardiac complications, stroke, pneumonia, among others.  I had a long discussion with the patient about the importance of stop drinking alcohol.  I discussed that he is at increased risk of this complication due to his decreased renal function.  Right now he is compensated so the benefit of the surgery are better than the risk but if he continued to drink alcohol he can deteriorate.  I discussed with the patient the risk of alcohol withdrawal that might even cause death after the surgery.  He and his family member understood the risk of drinking alcohol.  Malignant neoplasm of transverse colon (CMS-HCC) [  C18.4]  PLAN: 1. Robotic assisted partial colectomy with anastomosis 2. CEA 3. CBC, CMP, PT/INR 4. Avoid taking aspirin 5 days before surgery 5. Bowel prep the day before surgery 6. Take prescribed antibiotics the day before surgery.   Patient verbalized understanding, all questions were answered, and were agreeable with the plan outlined above.   I spent a total of 60 minutes in both face-to-face and non-face-to-face activities for this visit on the date of this encounter.   Herbert Pun, MD  Electronically signed by Herbert Pun, MD

## 2020-01-10 NOTE — H&P (Signed)
PATIENT PROFILE: Timothy Goodman is a 59 y.o. male who presents to the Clinic for consultation at the request of Dr. Alice Reichert for evaluation of colon cancer.  PCP:  None  HISTORY OF PRESENT ILLNESS: Timothy Goodman reports he had endoscopy and colonoscopy for evaluation of liver disease.  On the colonoscopy he was found with transverse colon ulcerated lesion.  Patient denies any change in bowel habits.  He denies weight loss.  He denies rectal bleeding.  He denies abdominal pain.  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  I personally evaluated the images of the colonoscopy and the report.  Patient has CT of the chest abdominal pelvis and there is no evidence of distant disease.  He was found with cirrhosis of the liver with compensated liver function.  Today the platelet shows 121,000 which is improved from 90,000 35-month ago.   PROBLEM LIST:     Problem List  Date Reviewed: 05/03/2018   None      GENERAL REVIEW OF SYSTEMS:   General ROS: negative for - chills, fatigue, fever, weight gain or weight loss Allergy and Immunology ROS: negative for - hives  Hematological and Lymphatic ROS: negative for - bleeding problems or bruising, negative for palpable nodes Endocrine ROS: negative for - heat or cold intolerance, hair changes Respiratory ROS: negative for - cough, shortness of breath or wheezing Cardiovascular ROS: no chest pain or palpitations GI ROS: negative for nausea, vomiting, abdominal pain, diarrhea, constipation Musculoskeletal ROS: negative for - joint swelling or muscle pain Neurological ROS: negative for - confusion, syncope Dermatological ROS: negative for pruritus and rash Psychiatric: negative for anxiety, depression, difficulty sleeping and memory loss  MEDICATIONS: Current Medications        Current Outpatient Medications  Medication Sig Dispense Refill  . mv-mn/folic [id number] (MEGA MULTIVITAMIN FOR MEN ORAL)  Take by mouth once daily [facility]    . metroNIDAZOLE (FLAGYL) 500 MG tablet Take 2 tablets at 2 pm, 3 pm and 10 pm the day before surgery. 6 tablet 0  . multivitamin with minerals tablet Take by mouth (Patient not taking: Reported on 01/10/2020  )    . neomycin 500 mg tablet Take 2 tablets at 2 pm, 3 pm and 10 pm the day before surgery. 6 tablet 0   No current facility-administered medications for this visit.      ALLERGIES: Patient has no known allergies.  PAST MEDICAL HISTORY: No past medical history on file.  PAST SURGICAL HISTORY:      Past Surgical History:  Procedure Laterality Date  . COLONOSCOPY  01/02/2020   Invasive colorectal adenocarcinoma, Well-differentiated  . EGD  01/02/2020   Esophageal stenosis.Dilated     FAMILY HISTORY:      Family History  Problem Relation Age of Onset  . Colon cancer Neg Hx      SOCIAL HISTORY: Social History          Socioeconomic History  . Marital status: Married    Spouse name: Not on file  . Number of children: Not on file  . Years of education: Not on file  . Highest education level: Not on file  Occupational History  . Not on file  Tobacco Use  . Smoking status: Current Some Day Smoker  . Smokeless tobacco: Never Used  Substance and Sexual Activity  . Alcohol use: Yes    Comment: Every weekend -   . Drug use: Not Currently  . Sexual activity: Not on file  Other Topics Concern  .  Not on file  Social History Narrative  . Not on file   Social Determinants of Health      Financial Resource Strain:   . Difficulty of Paying Living Expenses:   Food Insecurity:   . Worried About Charity fundraiser in the Last Year:   . Arboriculturist in the Last Year:   Transportation Needs:   . Film/video editor (Medical):   Marland Kitchen Lack of Transportation (Non-Medical):       PHYSICAL EXAM:    Vitals:   01/10/20 1057  BP: 130/80  Pulse: 92   Body mass index is 30.82 kg/m. Weight: 78.9 kg (174  lb)   GENERAL: Alert, active, oriented x3  HEENT: Pupils equal reactive to light. Extraocular movements are intact. Sclera clear. Palpebral conjunctiva normal red color.Pharynx clear.  NECK: Supple with no palpable mass and no adenopathy.  LUNGS: Sound clear with no rales rhonchi or wheezes.  HEART: Regular rhythm S1 and S2 without murmur.  ABDOMEN: Soft and depressible, nontender with no palpable mass, no hepatomegaly.   EXTREMITIES: Well-developed well-nourished symmetrical with no dependent edema.  NEUROLOGICAL: Awake alert oriented, facial expression symmetrical, moving all extremities.  REVIEW OF DATA: I have reviewed the following data today:      Initial consult on 01/10/2020  Component Date Value  . WBC (White Blood Cell Co* 01/10/2020 9.3   . RBC (Red Blood Cell Coun* 01/10/2020 5.11   . Hemoglobin 01/10/2020 16.9   . Hematocrit 01/10/2020 48.6   . MCV (Mean Corpuscular Vo* 01/10/2020 95.1   . MCH (Mean Corpuscular He* 01/10/2020 33.1*  . MCHC (Mean Corpuscular H* 01/10/2020 34.8   . Platelet Count 01/10/2020 121*  . RDW-CV (Red Cell Distrib* 01/10/2020 13.2   . MPV (Mean Platelet Volum* 01/10/2020 11.2   . Neutrophils 01/10/2020 6.32   . Lymphocytes 01/10/2020 1.82   . Monocytes 01/10/2020 0.85   . Eosinophils 01/10/2020 0.25   . Basophils 01/10/2020 0.06   . Neutrophil % 01/10/2020 67.9   . Lymphocyte % 01/10/2020 19.5   . Monocyte % 01/10/2020 9.1   . Eosinophil % 01/10/2020 2.7   . Basophil% 01/10/2020 0.6   . Immature Granulocyte % 01/10/2020 0.2   . Immature Granulocyte Cou* 01/10/2020 0.02      ASSESSMENT: Timothy Goodman is a 59 y.o. male presenting for consultation for transverse colon cancer.  Patient with ulcerated lesion in the proximal transverse colon positive for adenocarcinoma of the colon, well-differentiated.  The patient was oriented about the pathology results.  He was oriented about the surgical management that includes right  extended hemicolectomy.  I discussed with the patient the goal of the surgery that will be removed the cancer and the lymph nodes for staging.  I also discussed with the patient the risk of surgery that includes bleeding, infection, leak from anastomosis, abscess, bowel obstruction, fistulas, DVT, cardiac complications, stroke, pneumonia, among others.  I had a long discussion with the patient about the importance of stop drinking alcohol.  I discussed that he is at increased risk of this complication due to his decreased renal function.  Right now he is compensated so the benefit of the surgery are better than the risk but if he continued to drink alcohol he can deteriorate.  I discussed with the patient the risk of alcohol withdrawal that might even cause death after the surgery.  He and his family member understood the risk of drinking alcohol.  Malignant neoplasm of transverse colon (CMS-HCC) [  C18.4]  PLAN: 1. Robotic assisted partial colectomy with anastomosis 2. CEA 3. CBC, CMP, PT/INR 4. Avoid taking aspirin 5 days before surgery 5. Bowel prep the day before surgery 6. Take prescribed antibiotics the day before surgery.   Patient verbalized understanding, all questions were answered, and were agreeable with the plan outlined above.   I spent a total of 60 minutes in both face-to-face and non-face-to-face activities for this visit on the date of this encounter.   Herbert Pun, MD  Electronically signed by Herbert Pun, MD

## 2020-01-11 NOTE — Progress Notes (Signed)
Tumor Board Documentation  Timothy Goodman was presented by Dr Windell Moment at our Tumor Board on 01/10/2020, which included representatives from medical oncology, radiation oncology, pathology, navigation, radiology, surgical, internal medicine, pharmacy, pulmonology, palliative care, research.  Timothy Goodman currently presents as an external consult, for Timothy Goodman, for new positive pathology with history of the following treatments: surgical intervention(s), active survellience.  Additionally, we reviewed previous medical and familial history, history of present illness, and recent lab results along with all available histopathologic and imaging studies. The tumor board considered available treatment options and made the following recommendations: Surgery Refer to Medical Oncology  The following procedures/referrals were also placed: No orders of the defined types were placed in this encounter.   Clinical Trial Status: not discussed   Staging used: To be determined  AJCC Staging:       Group: Colorectal Adednocarcinoma   National site-specific guidelines   were discussed with respect to the case.  Tumor board is a meeting of clinicians from various specialty areas who evaluate and discuss patients for whom a multidisciplinary approach is being considered. Final determinations in the plan of care are those of the provider(s). The responsibility for follow up of recommendations given during tumor board is that of the provider.   Today's extended care, comprehensive team conference, Timothy Goodman was not present for the discussion and was not examined.   Multidisciplinary Tumor Board is a multidisciplinary case peer review process.  Decisions discussed in the Multidisciplinary Tumor Board reflect the opinions of the specialists present at the conference without having examined the patient.  Ultimately, treatment and diagnostic decisions rest with the primary provider(s) and the patient.

## 2020-01-22 ENCOUNTER — Inpatient Hospital Stay
Admission: RE | Admit: 2020-01-22 | Discharge: 2020-01-22 | Disposition: A | Discharge status: Home / Self Care | Payer: 59 | Source: Ambulatory Visit

## 2020-01-22 HISTORY — DX: Thrombocytopenia, unspecified: D69.6

## 2020-01-22 HISTORY — DX: Unspecified cirrhosis of liver: K74.60

## 2020-01-23 ENCOUNTER — Other Ambulatory Visit: Payer: Self-pay

## 2020-01-23 ENCOUNTER — Encounter
Admission: RE | Admit: 2020-01-23 | Discharge: 2020-01-23 | Disposition: A | Discharge status: Home / Self Care | Payer: 59 | Source: Ambulatory Visit | Attending: General Surgery | Admitting: General Surgery

## 2020-01-23 NOTE — Patient Instructions (Addendum)
Instrucciones Para Antes de la Ciruga   Su ciruga est programada para-Tuesday, June 15   [facility]     Por favor llame al [phone] despus de las 12 del medioda del da anterior a su procedimiento para preguntar sobre su hora de llegada.    No coma alimentos ni tome lquidos, incluyendo agua, despus de la medianoche del   Puede cepillarse los dientes en la maana de la [place].    No use joyas, maquillaje de ojos, lpiz labial, crema para el cuerpo o esmalte de uas oscuro.    Si va a ser ingresado despus de la ciruga, deje la [employer] en el carro hasta que se le haya asignado una habitacin.    Use ropa suelta y cmoda de regreso a casa.       Your procedure is scheduled on:  Tuesday, June 15 Report to Day Surgery on the 2nd floor of the [facility]. To find out your arrival time, please call [phone] between 1PM - 3PM on: Monday, June 14  REMEMBER: Instructions that are not followed completely may result in serious medical risk, up to and including death; or upon the discretion of your surgeon and anesthesiologist your surgery may need to be rescheduled.  Do not eat food after midnight the night before surgery.  No gum chewing, lozengers or hard candies.  You may however, drink water up to 2 hours before you are scheduled to arrive for your surgery. Do not drink anything within 2 hours of your scheduled arrival time.  Stop Anti-inflammatories (NSAIDS) such as Advil, Aleve, Ibuprofen, Motrin, Naproxen, Naprosyn and Aspirin based products such as Excedrin, Goodys Powder, BC Powder. (May take Tylenol or Acetaminophen if needed.)  Stop ANY OVER THE COUNTER supplements until after surgery.  No Alcohol for 24 hours before or after surgery.  On the morning of surgery brush your teeth with toothpaste and water, you may rinse your mouth with mouthwash if you wish. Do not swallow any toothpaste or mouthwash.  Do not wear jewelry.  Do not wear lotions,  powders, or perfumes.   Do not bring valuables to the hospital. [facility] is not responsible for any missing/lost belongings or valuables.   Use CHG Soap as directed on instruction sheet.  Bowel prep as directed by your surgeon.  Notify your doctor if there is any change in your medical condition (cold, fever, infection).  Wear comfortable clothing (specific to your surgery type) to the hospital.  Plan for stool softeners for home use; pain medications have a tendency to cause constipation. You can also help prevent constipation by eating foods high in fiber such as fruits and vegetables and drinking plenty of fluids as your diet allows.  After surgery, you can help prevent lung complications by doing breathing exercises.  Take deep breaths and cough every 1-2 hours. Your doctor may order a device called an Incentive Spirometer to help you take deep breaths. When coughing or sneezing, hold a pillow firmly against your incision with both hands. This is called "splinting." Doing this helps protect your incision. It also decreases belly discomfort.  If you are being admitted to the hospital overnight, leave your suitcase in the car. After surgery it may be brought to your room.  Please call the [facility] at [phone] if you have any questions about these instructions.  Visitation Policy:  Patients undergoing a surgery or procedure may have one family member or support person with them as long as that  person is not COVID-19 positive or experiencing its symptoms.  That person may remain in the waiting area during the procedure.  Inpatient Visitation Update:   Two designated support people may visit a patient during visiting hours 7 am to 8 pm. It must be the same two designated people for the duration of the patient stay. The visitors may come and go during the day, and there is no switching out to have different visitors. A mask must be worn at all times, including  in the patient room.  As a reminder, masks are still required for all [facility] team members, patients and visitors in all [facility] facilities.   Systemwide, no visitors 17 or younger.

## 2020-01-25 ENCOUNTER — Other Ambulatory Visit: Payer: Self-pay

## 2020-01-25 ENCOUNTER — Other Ambulatory Visit
Admission: RE | Admit: 2020-01-25 | Discharge: 2020-01-25 | Disposition: A | Discharge status: Home / Self Care | Payer: 59 | Source: Ambulatory Visit | Attending: General Surgery | Admitting: General Surgery

## 2020-01-25 DIAGNOSIS — Z01812 Encounter for preprocedural laboratory examination: Secondary | ICD-10-CM | POA: Insufficient documentation

## 2020-01-25 DIAGNOSIS — Z20822 Contact with and (suspected) exposure to covid-19: Secondary | ICD-10-CM | POA: Diagnosis not present

## 2020-01-25 LAB — TYPE AND SCREEN
ABO/RH(D): B NEG
Antibody Screen: NEGATIVE

## 2020-01-26 LAB — SARS CORONAVIRUS 2 (TAT 6-24 HRS): SARS Coronavirus 2: NEGATIVE

## 2020-01-28 MED ORDER — SODIUM CHLORIDE 0.9 % IV SOLN
2.0000 g | INTRAVENOUS | Status: AC
  Administered 2020-01-29 (×2): 2 g via INTRAVENOUS

## 2020-01-29 ENCOUNTER — Encounter: Payer: Self-pay | Admitting: General Surgery

## 2020-01-29 ENCOUNTER — Inpatient Hospital Stay
Admission: RE | Admit: 2020-01-29 | Discharge: 2020-01-31 | DRG: 331 | Disposition: A | Discharge status: Home / Self Care | Payer: 59 | Attending: General Surgery | Admitting: General Surgery

## 2020-01-29 ENCOUNTER — Other Ambulatory Visit: Payer: Self-pay

## 2020-01-29 ENCOUNTER — Inpatient Hospital Stay: Payer: 59 | Admitting: Certified Registered Nurse Anesthetist

## 2020-01-29 ENCOUNTER — Encounter
Admission: RE | Disposition: A | Discharge status: Home / Self Care | Payer: Self-pay | Source: Home / Self Care | Attending: General Surgery

## 2020-01-29 DIAGNOSIS — C182 Malignant neoplasm of ascending colon: Secondary | ICD-10-CM | POA: Diagnosis not present

## 2020-01-29 DIAGNOSIS — F17200 Nicotine dependence, unspecified, uncomplicated: Secondary | ICD-10-CM | POA: Diagnosis present

## 2020-01-29 DIAGNOSIS — C184 Malignant neoplasm of transverse colon: Secondary | ICD-10-CM | POA: Diagnosis present

## 2020-01-29 DIAGNOSIS — N289 Disorder of kidney and ureter, unspecified: Secondary | ICD-10-CM | POA: Diagnosis present

## 2020-01-29 DIAGNOSIS — K7460 Unspecified cirrhosis of liver: Secondary | ICD-10-CM | POA: Diagnosis present

## 2020-01-29 DIAGNOSIS — C189 Malignant neoplasm of colon, unspecified: Secondary | ICD-10-CM | POA: Diagnosis present

## 2020-01-29 HISTORY — PX: OTHER SURGICAL HISTORY: SHX169

## 2020-01-29 LAB — CBC
HCT: 43 % (ref 39.0–52.0)
Hemoglobin: 14.9 g/dL (ref 13.0–17.0)
MCH: 32.7 pg (ref 26.0–34.0)
MCHC: 34.7 g/dL (ref 30.0–36.0)
MCV: 94.5 fL (ref 80.0–100.0)
Platelets: 117 10*3/uL — ABNORMAL LOW (ref 150–400)
RBC: 4.55 MIL/uL (ref 4.22–5.81)
RDW: 13.4 % (ref 11.5–15.5)
WBC: 10 10*3/uL (ref 4.0–10.5)
nRBC: 0 % (ref 0.0–0.2)

## 2020-01-29 LAB — ABO/RH: ABO/RH(D): B NEG

## 2020-01-29 LAB — CREATININE, SERUM
Creatinine, Ser: 1.07 mg/dL (ref 0.61–1.24)
GFR calc Af Amer: >60 mL/min (ref >60)
GFR calc non Af Amer: >60 mL/min (ref >60)

## 2020-01-29 SURGERY — COLECTOMY, PARTIAL, ROBOT-ASSISTED, LAPAROSCOPIC
Anesthesia: General | Site: Abdomen

## 2020-01-29 MED ORDER — KETAMINE HCL 50 MG/ML IJ SOLN
INTRAMUSCULAR | Status: DC | PRN
Start: 2020-01-29 — End: 2020-01-29
  Administered 2020-01-29 (×5): 10 mg via INTRAMUSCULAR
  Administered 2020-01-29: 50 mg via INTRAMUSCULAR

## 2020-01-29 MED ORDER — FENTANYL CITRATE (PF) 100 MCG/2ML IJ SOLN
INTRAMUSCULAR | Status: DC | PRN
  Administered 2020-01-29 (×2): 50 ug via INTRAVENOUS
  Administered 2020-01-29: 25 ug via INTRAVENOUS
  Administered 2020-01-29 (×2): 50 ug via INTRAVENOUS
  Administered 2020-01-29: 25 ug via INTRAVENOUS

## 2020-01-29 MED ORDER — SODIUM CHLORIDE 0.9 % IV SOLN
INTRAVENOUS | Status: AC
  Filled 2020-01-29: qty 2

## 2020-01-29 MED ORDER — OXYCODONE HCL 5 MG/5ML PO SOLN: 5.0000 mg | Freq: Once | ORAL | Status: DC | PRN

## 2020-01-29 MED ORDER — LACTATED RINGERS IV SOLN: INTRAVENOUS | Status: DC

## 2020-01-29 MED ORDER — LACTATED RINGERS IV SOLN: INTRAVENOUS | Status: DC | PRN

## 2020-01-29 MED ORDER — OXYCODONE HCL 5 MG PO TABS: 5.0000 mg | ORAL_TABLET | Freq: Once | ORAL | Status: DC | PRN

## 2020-01-29 MED ORDER — KETAMINE HCL 50 MG/ML IJ SOLN
INTRAMUSCULAR | Status: AC
  Filled 2020-01-29: qty 10

## 2020-01-29 MED ORDER — ONDANSETRON HCL 4 MG/2ML IJ SOLN
INTRAMUSCULAR | Status: DC | PRN
  Administered 2020-01-29: 4 mg via INTRAVENOUS

## 2020-01-29 MED ORDER — GABAPENTIN 300 MG PO CAPS
ORAL_CAPSULE | ORAL | Status: AC
  Administered 2020-01-29: 300 mg via ORAL
  Filled 2020-01-29: qty 1

## 2020-01-29 MED ORDER — SUGAMMADEX SODIUM 200 MG/2ML IV SOLN
INTRAVENOUS | Status: DC | PRN
  Administered 2020-01-29: 200 mg via INTRAVENOUS

## 2020-01-29 MED ORDER — ONDANSETRON HCL 4 MG/2ML IJ SOLN: 4.0000 mg | Freq: Once | INTRAMUSCULAR | Status: DC | PRN

## 2020-01-29 MED ORDER — SODIUM CHLORIDE 0.9 % IV SOLN
INTRAVENOUS | Status: DC | PRN
  Administered 2020-01-29: 20 ug/min via INTRAVENOUS

## 2020-01-29 MED ORDER — DEXAMETHASONE SODIUM PHOSPHATE 10 MG/ML IJ SOLN
INTRAMUSCULAR | Status: DC | PRN
  Administered 2020-01-29: 10 mg via INTRAVENOUS

## 2020-01-29 MED ORDER — SODIUM CHLORIDE 0.9 % IV SOLN
2.0000 g | Freq: Two times a day (BID) | INTRAVENOUS | Status: AC
  Administered 2020-01-29 – 2020-01-30 (×2): 2 g via INTRAVENOUS
  Filled 2020-01-29 (×2): qty 2

## 2020-01-29 MED ORDER — GABAPENTIN 300 MG PO CAPS: 300.0000 mg | ORAL_CAPSULE | ORAL | Status: AC

## 2020-01-29 MED ORDER — CELECOXIB 200 MG PO CAPS
200.0000 mg | ORAL_CAPSULE | Freq: Two times a day (BID) | ORAL | Status: DC
  Administered 2020-01-29 – 2020-01-31 (×4): 200 mg via ORAL
  Filled 2020-01-29 (×5): qty 1

## 2020-01-29 MED ORDER — CHLORHEXIDINE GLUCONATE 0.12 % MT SOLN
OROMUCOSAL | Status: AC
  Administered 2020-01-29: 15 mL via OROMUCOSAL
  Filled 2020-01-29: qty 15

## 2020-01-29 MED ORDER — BUPIVACAINE HCL (PF) 0.5 % IJ SOLN
INTRAMUSCULAR | Status: DC | PRN
  Administered 2020-01-29: 30 mL

## 2020-01-29 MED ORDER — ENOXAPARIN SODIUM 40 MG/0.4ML ~~LOC~~ SOLN
40.0000 mg | SUBCUTANEOUS | Status: DC
  Administered 2020-01-30 – 2020-01-31 (×2): 40 mg via SUBCUTANEOUS
  Filled 2020-01-29 (×2): qty 0.4

## 2020-01-29 MED ORDER — ORAL CARE MOUTH RINSE: 15.0000 mL | Freq: Once | OROMUCOSAL | Status: AC

## 2020-01-29 MED ORDER — SODIUM CHLORIDE 0.9 % IV SOLN
2.0000 g | Freq: Once | INTRAVENOUS | Status: DC
  Filled 2020-01-29: qty 2

## 2020-01-29 MED ORDER — PROPOFOL 10 MG/ML IV BOLUS
INTRAVENOUS | Status: AC
  Filled 2020-01-29: qty 20

## 2020-01-29 MED ORDER — ACETAMINOPHEN 500 MG PO TABS: 1000.0000 mg | ORAL_TABLET | ORAL | Status: AC

## 2020-01-29 MED ORDER — INDOCYANINE GREEN 25 MG IV SOLR
INTRAVENOUS | Status: DC | PRN
  Administered 2020-01-29 (×3): 5 mg via INTRAVENOUS

## 2020-01-29 MED ORDER — BUPIVACAINE LIPOSOME 1.3 % IJ SUSP: 20.0000 mL | Freq: Once | INTRAMUSCULAR | Status: DC

## 2020-01-29 MED ORDER — FENTANYL CITRATE (PF) 100 MCG/2ML IJ SOLN
INTRAMUSCULAR | Status: AC
  Filled 2020-01-29: qty 2

## 2020-01-29 MED ORDER — CELECOXIB 200 MG PO CAPS: 200.0000 mg | ORAL_CAPSULE | ORAL | Status: AC

## 2020-01-29 MED ORDER — ALVIMOPAN 12 MG PO CAPS
ORAL_CAPSULE | ORAL | Status: AC
  Administered 2020-01-29: 12 mg via ORAL
  Filled 2020-01-29: qty 1

## 2020-01-29 MED ORDER — DEXMEDETOMIDINE HCL 200 MCG/2ML IV SOLN
INTRAVENOUS | Status: DC | PRN
  Administered 2020-01-29 (×4): 8 ug via INTRAVENOUS

## 2020-01-29 MED ORDER — FENTANYL CITRATE (PF) 100 MCG/2ML IJ SOLN: 25.0000 ug | INTRAMUSCULAR | Status: DC | PRN

## 2020-01-29 MED ORDER — BUPIVACAINE LIPOSOME 1.3 % IJ SUSP
INTRAMUSCULAR | Status: DC | PRN
  Administered 2020-01-29: 20 mL

## 2020-01-29 MED ORDER — ONDANSETRON HCL 4 MG/2ML IJ SOLN: 4.0000 mg | Freq: Four times a day (QID) | INTRAMUSCULAR | Status: DC | PRN

## 2020-01-29 MED ORDER — MIDAZOLAM HCL 2 MG/2ML IJ SOLN
INTRAMUSCULAR | Status: AC
  Filled 2020-01-29: qty 2

## 2020-01-29 MED ORDER — ROCURONIUM BROMIDE 10 MG/ML (PF) SYRINGE
PREFILLED_SYRINGE | INTRAVENOUS | Status: AC
  Filled 2020-01-29: qty 20

## 2020-01-29 MED ORDER — ACETAMINOPHEN 500 MG PO TABS
ORAL_TABLET | ORAL | Status: AC
  Administered 2020-01-29: 1000 mg via ORAL
  Filled 2020-01-29: qty 2

## 2020-01-29 MED ORDER — SODIUM CHLORIDE 0.9 % IV SOLN: INTRAVENOUS | Status: DC

## 2020-01-29 MED ORDER — LIDOCAINE HCL (PF) 2 % IJ SOLN
INTRAMUSCULAR | Status: AC
  Filled 2020-01-29: qty 20

## 2020-01-29 MED ORDER — LIDOCAINE HCL (PF) 2 % IJ SOLN
INTRAMUSCULAR | Status: AC
  Filled 2020-01-29: qty 5

## 2020-01-29 MED ORDER — ALVIMOPAN 12 MG PO CAPS: 12.0000 mg | ORAL_CAPSULE | ORAL | Status: AC

## 2020-01-29 MED ORDER — CELECOXIB 200 MG PO CAPS
ORAL_CAPSULE | ORAL | Status: AC
  Administered 2020-01-29: 200 mg via ORAL
  Filled 2020-01-29: qty 1

## 2020-01-29 MED ORDER — SUCCINYLCHOLINE CHLORIDE 200 MG/10ML IV SOSY
PREFILLED_SYRINGE | INTRAVENOUS | Status: AC
  Filled 2020-01-29: qty 10

## 2020-01-29 MED ORDER — CHLORHEXIDINE GLUCONATE 0.12 % MT SOLN: 15.0000 mL | Freq: Once | OROMUCOSAL | Status: AC

## 2020-01-29 MED ORDER — LIDOCAINE HCL (PF) 2 % IJ SOLN
INTRAMUSCULAR | Status: DC | PRN
Start: 2020-01-29 — End: 2020-01-29
  Administered 2020-01-29: 1.014 mg/kg/h via INTRADERMAL

## 2020-01-29 MED ORDER — FAMOTIDINE 20 MG PO TABS
ORAL_TABLET | ORAL | Status: AC
  Administered 2020-01-29: 20 mg via ORAL
  Filled 2020-01-29: qty 1

## 2020-01-29 MED ORDER — PROPOFOL 10 MG/ML IV BOLUS
INTRAVENOUS | Status: DC | PRN
  Administered 2020-01-29: 20 mg via INTRAVENOUS
  Administered 2020-01-29: 30 mg via INTRAVENOUS
  Administered 2020-01-29: 200 mg via INTRAVENOUS

## 2020-01-29 MED ORDER — ONDANSETRON HCL 4 MG/2ML IJ SOLN
INTRAMUSCULAR | Status: AC
  Filled 2020-01-29: qty 2

## 2020-01-29 MED ORDER — ONDANSETRON 4 MG PO TBDP: 4.0000 mg | ORAL_TABLET | Freq: Four times a day (QID) | ORAL | Status: DC | PRN

## 2020-01-29 MED ORDER — PHENYLEPHRINE HCL (PRESSORS) 10 MG/ML IV SOLN
INTRAVENOUS | Status: DC | PRN
  Administered 2020-01-29 (×3): 100 ug via INTRAVENOUS
  Administered 2020-01-29: 50 ug via INTRAVENOUS
  Administered 2020-01-29 (×2): 100 ug via INTRAVENOUS
  Administered 2020-01-29: 50 ug via INTRAVENOUS
  Administered 2020-01-29 (×5): 100 ug via INTRAVENOUS

## 2020-01-29 MED ORDER — SUCCINYLCHOLINE CHLORIDE 20 MG/ML IJ SOLN
INTRAMUSCULAR | Status: DC | PRN
  Administered 2020-01-29: 50 mg via INTRAVENOUS

## 2020-01-29 MED ORDER — GABAPENTIN 300 MG PO CAPS
300.0000 mg | ORAL_CAPSULE | Freq: Two times a day (BID) | ORAL | Status: DC
  Administered 2020-01-29 – 2020-01-31 (×4): 300 mg via ORAL
  Filled 2020-01-29 (×4): qty 1

## 2020-01-29 MED ORDER — HYDROCODONE-ACETAMINOPHEN 5-325 MG PO TABS
1.0000 | ORAL_TABLET | ORAL | Status: DC | PRN
  Administered 2020-01-29 – 2020-01-30 (×4): 2 via ORAL
  Administered 2020-01-31: 1 via ORAL
  Filled 2020-01-29 (×2): qty 2
  Filled 2020-01-29: qty 1
  Filled 2020-01-29 (×3): qty 2

## 2020-01-29 MED ORDER — MORPHINE SULFATE (PF) 4 MG/ML IV SOLN
4.0000 mg | INTRAVENOUS | Status: DC | PRN
  Administered 2020-01-29 – 2020-01-30 (×2): 4 mg via INTRAVENOUS
  Filled 2020-01-29 (×2): qty 1

## 2020-01-29 MED ORDER — SODIUM CHLORIDE (PF) 0.9 % IJ SOLN
INTRAMUSCULAR | Status: AC
  Filled 2020-01-29: qty 10

## 2020-01-29 MED ORDER — BUPIVACAINE HCL (PF) 0.5 % IJ SOLN
INTRAMUSCULAR | Status: AC
  Filled 2020-01-29: qty 30

## 2020-01-29 MED ORDER — MIDAZOLAM HCL 2 MG/2ML IJ SOLN
INTRAMUSCULAR | Status: DC | PRN
  Administered 2020-01-29: 2 mg via INTRAVENOUS

## 2020-01-29 MED ORDER — BUPIVACAINE LIPOSOME 1.3 % IJ SUSP
INTRAMUSCULAR | Status: AC
  Filled 2020-01-29: qty 20

## 2020-01-29 MED ORDER — PANTOPRAZOLE SODIUM 40 MG IV SOLR
40.0000 mg | Freq: Every day | INTRAVENOUS | Status: DC
  Administered 2020-01-29 – 2020-01-30 (×2): 40 mg via INTRAVENOUS
  Filled 2020-01-29 (×2): qty 40

## 2020-01-29 MED ORDER — ALVIMOPAN 12 MG PO CAPS
12.0000 mg | ORAL_CAPSULE | Freq: Two times a day (BID) | ORAL | Status: DC
  Administered 2020-01-30: 12 mg via ORAL
  Filled 2020-01-29 (×2): qty 1

## 2020-01-29 MED ORDER — EPHEDRINE SULFATE 50 MG/ML IJ SOLN
INTRAMUSCULAR | Status: DC | PRN
  Administered 2020-01-29: 5 mg via INTRAVENOUS
  Administered 2020-01-29: 10 mg via INTRAVENOUS
  Administered 2020-01-29 (×2): 5 mg via INTRAVENOUS

## 2020-01-29 MED ORDER — LIDOCAINE HCL (CARDIAC) PF 100 MG/5ML IV SOSY
PREFILLED_SYRINGE | INTRAVENOUS | Status: DC | PRN
  Administered 2020-01-29: 100 mg via INTRAVENOUS

## 2020-01-29 MED ORDER — ROCURONIUM BROMIDE 100 MG/10ML IV SOLN
INTRAVENOUS | Status: DC | PRN
  Administered 2020-01-29 (×5): 20 mg via INTRAVENOUS
  Administered 2020-01-29: 50 mg via INTRAVENOUS
  Administered 2020-01-29: 20 mg via INTRAVENOUS
  Administered 2020-01-29: 30 mg via INTRAVENOUS

## 2020-01-29 MED ORDER — FAMOTIDINE 20 MG PO TABS: 20.0000 mg | ORAL_TABLET | Freq: Once | ORAL | Status: AC

## 2020-01-29 SURGICAL SUPPLY — 96 items
"PENCIL ELECTRO HAND CTR " (MISCELLANEOUS) ×2 IMPLANT
BAG LAPAROSCOPIC 12 15 PORT 16 (BASKET) IMPLANT
BAG RETRIEVAL 12/15 (BASKET) ×3
BAG RETRIEVAL 12/15MM (BASKET) ×1
BLADE CLIPPER SURG (BLADE) ×4 IMPLANT
BLADE SURG SZ10 CARB STEEL (BLADE) ×2 IMPLANT
BLADE SURG SZ11 CARB STEEL (BLADE) ×4 IMPLANT
CANISTER SUCT 1200ML W/VALVE (MISCELLANEOUS) ×4 IMPLANT
CANNULA REDUC XI 12-8 STAPL (CANNULA) ×1
CANNULA REDUC XI 12-8MM STAPL (CANNULA) ×1
CANNULA REDUCER 12-8 DVNC XI (CANNULA) ×2 IMPLANT
CHLORAPREP W/TINT 26 (MISCELLANEOUS) ×4 IMPLANT
CLIP VESOLOCK MED LG 6/CT (CLIP) IMPLANT
COVER TIP SHEARS 8 DVNC (MISCELLANEOUS) ×2 IMPLANT
COVER TIP SHEARS 8MM DA VINCI (MISCELLANEOUS) ×2
COVER WAND RF STERILE (DRAPES) IMPLANT
DEFOGGER SCOPE WARMER CLEARIFY (MISCELLANEOUS) ×4 IMPLANT
DERMABOND ADVANCED (GAUZE/BANDAGES/DRESSINGS)
DERMABOND ADVANCED .7 DNX12 (GAUZE/BANDAGES/DRESSINGS) IMPLANT
DRAPE ARM DVNC X/XI (DISPOSABLE) ×8 IMPLANT
DRAPE COLUMN DVNC XI (DISPOSABLE) ×2 IMPLANT
DRAPE DA VINCI XI ARM (DISPOSABLE) ×8
DRAPE DA VINCI XI COLUMN (DISPOSABLE) ×2
DRSG OPSITE POSTOP 4X10 (GAUZE/BANDAGES/DRESSINGS) IMPLANT
DRSG OPSITE POSTOP 4X8 (GAUZE/BANDAGES/DRESSINGS) IMPLANT
ELECT BLADE 6.5 EXT (BLADE) IMPLANT
ELECT CAUTERY BLADE 6.4 (BLADE) IMPLANT
ELECT REM PT RETURN 9FT ADLT (ELECTROSURGICAL) ×4
ELECTRODE REM PT RTRN 9FT ADLT (ELECTROSURGICAL) ×2 IMPLANT
GLOVE BIO SURGEON STRL SZ 6.5 (GLOVE) ×9 IMPLANT
GLOVE BIO SURGEONS STRL SZ 6.5 (GLOVE) ×3
GLOVE BIOGEL PI IND STRL 6.5 (GLOVE) ×6 IMPLANT
GLOVE BIOGEL PI INDICATOR 6.5 (GLOVE) ×6
GOWN STRL REUS W/ TWL LRG LVL3 (GOWN DISPOSABLE) ×12 IMPLANT
GOWN STRL REUS W/TWL LRG LVL3 (GOWN DISPOSABLE) ×12
GRASPER SUT TROCAR 14GX15 (MISCELLANEOUS) IMPLANT
HANDLE YANKAUER SUCT BULB TIP (MISCELLANEOUS) ×2 IMPLANT
IRRIGATION STRYKERFLOW (MISCELLANEOUS) IMPLANT
IRRIGATOR STRYKERFLOW (MISCELLANEOUS) ×4
IRRIGATOR SUCT 8 DISP DVNC XI (IRRIGATION / IRRIGATOR) IMPLANT
IRRIGATOR SUCTION 8MM XI DISP (IRRIGATION / IRRIGATOR)
IV NS 1000ML (IV SOLUTION) ×2
IV NS 1000ML BAXH (IV SOLUTION) IMPLANT
KIT IMAGING PINPOINTPAQ (MISCELLANEOUS) ×4 IMPLANT
KIT PINK PAD W/HEAD ARE REST (MISCELLANEOUS) ×4
KIT PINK PAD W/HEAD ARM REST (MISCELLANEOUS) ×2 IMPLANT
LABEL OR SOLS (LABEL) IMPLANT
NEEDLE HYPO 22GX1.5 SAFETY (NEEDLE) ×4 IMPLANT
OBTURATOR OPTICAL STANDARD 8MM (TROCAR) ×2
OBTURATOR OPTICAL STND 8 DVNC (TROCAR) ×2
OBTURATOR OPTICALSTD 8 DVNC (TROCAR) ×2 IMPLANT
PACK COLON CLEAN CLOSURE (MISCELLANEOUS) ×2 IMPLANT
PACK LAP CHOLECYSTECTOMY (MISCELLANEOUS) ×4 IMPLANT
PENCIL ELECTRO HAND CTR (MISCELLANEOUS) ×4 IMPLANT
PORT ACCESS TROCAR AIRSEAL 5 (TROCAR) ×4 IMPLANT
RELOAD STAPLE 45 3.5 BLU DVNC (STAPLE) IMPLANT
RELOAD STAPLE 60 3.5 BLU DVNC (STAPLE) IMPLANT
RELOAD STAPLER 3.5X45 BLU DVNC (STAPLE) IMPLANT
RELOAD STAPLER 3.5X60 BLU DVNC (STAPLE) ×6 IMPLANT
RETRACTOR RING XSMALL (MISCELLANEOUS) IMPLANT
RTRCTR WOUND ALEXIS 13CM XS SH (MISCELLANEOUS)
SEAL CANN UNIV 5-8 DVNC XI (MISCELLANEOUS) ×6 IMPLANT
SEAL XI 5MM-8MM UNIVERSAL (MISCELLANEOUS) ×6
SEALER VESSEL DA VINCI XI (MISCELLANEOUS) ×2
SEALER VESSEL EXT DVNC XI (MISCELLANEOUS) IMPLANT
SET TRI-LUMEN FLTR TB AIRSEAL (TUBING) ×4 IMPLANT
SOLUTION ELECTROLUBE (MISCELLANEOUS) ×4 IMPLANT
SPONGE LAP 4X18 RFD (DISPOSABLE) ×4 IMPLANT
STAPLER 45 DA VINCI SURE FORM (STAPLE)
STAPLER 45 SUREFORM DVNC (STAPLE) IMPLANT
STAPLER 60 DA VINCI SURE FORM (STAPLE) ×2
STAPLER 60 SUREFORM DVNC (STAPLE) IMPLANT
STAPLER CANNULA SEAL DVNC XI (STAPLE) ×2 IMPLANT
STAPLER CANNULA SEAL XI (STAPLE) ×2
STAPLER RELOAD 3.5X45 BLU DVNC (STAPLE)
STAPLER RELOAD 3.5X45 BLUE (STAPLE)
STAPLER RELOAD 3.5X60 BLU DVNC (STAPLE) ×6
STAPLER RELOAD 3.5X60 BLUE (STAPLE) ×6
SUT MNCRL 4-0 (SUTURE)
SUT MNCRL 4-0 27XMFL (SUTURE)
SUT MNCRL AB 4-0 PS2 18 (SUTURE) ×4 IMPLANT
SUT PDS PLUS 0 (SUTURE)
SUT PDS PLUS AB 0 CT-2 (SUTURE) ×4 IMPLANT
SUT SILK 0 SH 30 (SUTURE) ×4 IMPLANT
SUT SILK 2 0 SH (SUTURE) ×2 IMPLANT
SUT SILK 3-0 (SUTURE) IMPLANT
SUT VIC AB 3-0 SH 27 (SUTURE) ×2
SUT VIC AB 3-0 SH 27X BRD (SUTURE) ×8 IMPLANT
SUT VICRYL 0 AB UR-6 (SUTURE) ×6 IMPLANT
SUT VLOC 90 6 CV-15 VIOLET (SUTURE) ×3 IMPLANT
SUT VLOC 90 6" CV-15 VIOLET (SUTURE) ×1
SUTURE MNCRL 4-0 27XMF (SUTURE) ×4 IMPLANT
SYR 30ML LL (SYRINGE) ×8 IMPLANT
SYS TROCAR 1.5-3 SLV ABD GEL (ENDOMECHANICALS) ×4
SYSTEM TROCR 1.5-3 SLV ABD GEL (ENDOMECHANICALS) ×2 IMPLANT
TRAY FOLEY MTR SLVR 16FR STAT (SET/KITS/TRAYS/PACK) ×4 IMPLANT

## 2020-01-29 NOTE — Op Note (Addendum)
Preoperative diagnosis: Transverse colon cancer  Postoperative diagnosis: Transverse colon cancer  Procedure: Robotic assisted laparoscopic extended right colectomy.   Anesthesia: GETA   Surgeon: Herbert Pun, MD  Assistant: Dr. Dahlia Byes. His assistance was needed during this case due to its complexity, for exposure and expertise.    Wound Classification: clean contaminated   Specimen: Right colon   Complications: None   Estimated Blood Loss: 50 mL   Indications: Patient is a transverse colon ulcer that was biopsied and found with adenocarcinoma. CEA elevated. CT of the chest abdomen and pelvis negative for distant metastasis. Extended right colectomy was elected for treatment.      FIndings: 1.  Transverse colon with multiple tattoo 2.  Adequate hemostasis.  4.  No gross metastasis noted   Description of procedure: The patient was placed on the operating table in the supine position, both arms tucked. General anesthesia was induced. A time-out was completed verifying correct patient, procedure, site, positioning, and implant(s) and/or special equipment prior to beginning this procedure. The abdomen was prepped and draped in the usual sterile fashion.    Incision was done on the left lower quadrant 2 cm from ASIS. Dissection carried down and fascia opened. Abdominal cavity entered. Mini Gelport was placed.  Gas insufflation was initiated until the abdominal pressure was measured at 15 mmHg.  Afterwards, a 12 mm Trocar was inserted though the Gelport. 3 additional incision was made 5 cm apart along the left side of the abdominal wall from the initial incision and 12mm port placed under direct visualization. 5 mm assistant port was then placed between 2 of the robotic ports.  No injuries from trocar placements were noted. The table was placed in the reverse Trendelenburg position with the right side elevated.  Xi robotic platform was then brought to the operative field and docked at  an angle from the left lower quadrant.  Tip up grasper and scissors was placed in right arm ports.  Fenestrated bipolar in left arm port.   Examination of the abdominal cavity noted no signs of gross metastasis.  The transverse colon with inking was noted.  Dissection was started by removing the lateral attachments of the right colon along the white line of Toldt, ensuring the right ureter was not involved.  This was carried around the hepatic flexure to the mid portion of the transverse colon.  Afterwards, the right colon was grasped and elevated to visualize mesentary. Point was chosen on the transverse colon for staple transection, measuring at least 5 cm from the mass.  57mm blue load stapler was then used to transect the colon at this point.  Vessel sealer was then used to transect the right colon mesentery towards a previously determined point on the terminal ileum, care taken to ensure as much mesentery was taken for lymph node evaluation, and also visualizing the duodenum and placing it away from area of transection during this portion.  Once the terminal ileum was reached, 2mm blue load stapler was used to transect the terminal ileum.  ICG was then infused and the staple lines were confirmed to have adequate blood flow.   The transected specimen was placed atop the liver.  The small bowel was then brought towards the transverse colon and a isoperistaltic anastomosis was created.  Enterotomies were made in the small bowel and the transverse colon, small bowel enterotomy created 2 cm from the staple line and transverse colon enterotomy made 8 cm from the staple line.  60 mm blue load stapler  placed through these enterotomies and new anastomosis created.  The enterotomy was then closed by placing 2 anchor sutures at the 2 apexes using 2-0 Vicryl, then running 3-0 V-Lock in a 2 layer fashion.    No bleeding or additional pathology was noted.12 mm port removed and a large Endo Catch bag was placed through  the 12 mm port site and into the abdomen.  Right colon specimen was placed in the bag.  Robot was then undocked, and the remaining port sites were removed, the abdomen was allowed to collapse.  The specimen was able to be removed out of the abdomen completely.  This port site was then closed primarily using 0 PDS x2. Deep dermal then closed with 3-0 vicryl interrupted fashion.     Exparel infused at all port sites. All skin incisions then closed with subcuticular sutures Monocryl 4-0.  Wounds then dressed with dermabond.   The patient tolerated the procedure well, awakened from anesthesia and was taken to the postanesthesia care unit in satisfactory condition.  Foley still in place.  Sponge count and instrument count correct at the end of the procedure.

## 2020-01-29 NOTE — Anesthesia Procedure Notes (Signed)
Procedure Name: Intubation Date/Time: 01/29/2020 7:47 AM Performed by: Caryl Asp, CRNA Pre-anesthesia Checklist: Patient identified, Patient being monitored, Timeout performed, Emergency Drugs available and Suction available Patient Re-evaluated:Patient Re-evaluated prior to induction Oxygen Delivery Method: Circle system utilized Preoxygenation: Pre-oxygenation with 100% oxygen Induction Type: IV induction Ventilation: Mask ventilation without difficulty Laryngoscope Size: McGraph and 4 Grade View: Grade I Tube type: Oral Tube size: 7.5 mm Number of attempts: 1 Airway Equipment and Method: Stylet and Video-laryngoscopy Placement Confirmation: ETT inserted through vocal cords under direct vision,  positive ETCO2 and breath sounds checked- equal and bilateral Secured at: 22 cm Tube secured with: Tape Dental Injury: Teeth and Oropharynx as per pre-operative assessment

## 2020-01-29 NOTE — Interval H&P Note (Signed)
History and Physical Interval Note:  01/29/2020 6:57 AM  Timothy Goodman  has presented today for surgery, with the diagnosis of C18.4 Malignant neoplasm of transverse colon.  The various methods of treatment have been discussed with the patient and family. After consideration of risks, benefits and other options for treatment, the patient has consented to  Procedure(s): XI ROBOT ASSISTED LAPAROSCOPIC PARTIAL COLECTOMY (N/A) as a surgical intervention.  The patient's history has been reviewed, patient examined, no change in status, stable for surgery.  I have reviewed the patient's chart and labs.  Questions were answered to the patient's satisfaction.     Herbert Pun

## 2020-01-29 NOTE — Progress Notes (Signed)
Dr. Peyton Najjar ok with removed artertial line in patient.  Will remove and he will place order as well.

## 2020-01-29 NOTE — Anesthesia Procedure Notes (Signed)
Arterial Line Insertion Start/End6/15/2021 7:50 AM, 01/29/2020 8:00 AM Performed by: Tera Mater, MD, anesthesiologist  Patient location: Pre-op. Preanesthetic checklist: patient identified, IV checked, site marked, risks and benefits discussed, surgical consent, monitors and equipment checked, pre-op evaluation, timeout performed and anesthesia consent Lidocaine 1% used for infiltration and patient sedated Left, radial was placed Catheter size: 20 Fr Hand hygiene performed  and maximum sterile barriers used   Attempts: 1 Procedure performed using ultrasound guided technique. Ultrasound Notes:anatomy identified, needle tip was noted to be adjacent to the nerve/plexus identified and no ultrasound evidence of intravascular and/or intraneural injection Following insertion, dressing applied. Post procedure assessment: normal and unchanged  Patient tolerated the procedure well with no immediate complications.

## 2020-01-29 NOTE — Progress Notes (Signed)
15 minute call to floor. 

## 2020-01-29 NOTE — Progress Notes (Signed)
°   01/29/20 0700  Clinical Encounter Type  Visited With Patient  Visit Type Initial  Referral From Nurse  Consult/Referral To Chaplain  While rounding patient's nurse told chaplain that patient might benefit from a visit. Chaplain said that she wanted to visit with him, but realize he speaks [place]. Nurse explained that patient understands English quite well. Chaplain went patient's room and the visit was receptive. Chaplain briefly spoke with patient and asked if she could pray for him and he said yes. Chaplain prayed and told patient she will follow up with him once on the floor.

## 2020-01-29 NOTE — Anesthesia Preprocedure Evaluation (Signed)
Anesthesia Evaluation  Patient identified by MRN, date of birth, ID band Patient awake    Reviewed: Allergy & Precautions, H&P , NPO status , Patient's Chart, lab work & pertinent test results  Airway Mallampati: III  TM Distance: >3 FB Neck ROM: full    Dental  (+) Chipped   Pulmonary neg COPD, Current Smoker and Patient abstained from smoking.,    Pulmonary exam normal        Cardiovascular (-) hypertension(-) angina(-) Past MI negative cardio ROS Normal cardiovascular exam(-) dysrhythmias  Rhythm:regular Rate:Normal     Neuro/Psych negative neurological ROS  negative psych ROS   GI/Hepatic negative GI ROS, (+) Cirrhosis     substance abuse  alcohol use,   Endo/Other  negative endocrine ROS  Renal/GU      Musculoskeletal   Abdominal   Peds  Hematology Thrombocytopenia 120K and elevated INR 1.4   Anesthesia Other Findings Past Medical History: 12/2019: Cancer of transverse colon ([place]) No date: Cirrhosis of liver (HCC) No date: Thrombocytopenia ([place])  Past Surgical History: 01/02/2020: COLONOSCOPY WITH PROPOFOL; N/A     Comment:  Procedure: COLONOSCOPY WITH PROPOFOL;  Surgeon: [place],               Benay Pike, MD;  Location: [facility] ENDOSCOPY;  Service:               Gastroenterology;  Laterality: N/A; 01/02/2020: ESOPHAGOGASTRODUODENOSCOPY (EGD) WITH PROPOFOL; N/A     Comment:  Procedure: ESOPHAGOGASTRODUODENOSCOPY (EGD) WITH               PROPOFOL;  Surgeon: Toledo, Benay Pike, MD;  Location:               [facility] ENDOSCOPY;  Service: Gastroenterology;  Laterality:               N/A;     Reproductive/Obstetrics negative OB ROS                             Anesthesia Physical Anesthesia Plan  ASA: III  Anesthesia Plan: General ETT   Post-op Pain Management:    Induction:   PONV Risk Score and Plan: Ondansetron, Dexamethasone, Midazolam and Treatment may vary due to age or medical  condition  Airway Management Planned:   Additional Equipment:   Intra-op Plan:   Post-operative Plan:   Informed Consent: I have reviewed the patients History and Physical, chart, labs and discussed the procedure including the risks, benefits and alternatives for the proposed anesthesia with the patient or authorized representative who has indicated his/her understanding and acceptance.     Dental Advisory Given  Plan Discussed with: Anesthesiologist, CRNA and Surgeon  Anesthesia Plan Comments:         Anesthesia Quick Evaluation

## 2020-01-29 NOTE — Transfer of Care (Signed)
Immediate Anesthesia Transfer of Care Note  Patient: Timothy Goodman  Procedure(s) Performed: XI ROBOT ASSISTED LAPAROSCOPIC PARTIAL COLECTOMY (N/A Abdomen) INDOCYANINE GREEN FLUORESCENCE IMAGING (ICG)  Patient Location: PACU  Anesthesia Type:General  Level of Consciousness: drowsy  Airway & Oxygen Therapy: Patient Spontanous Breathing and Patient connected to face mask oxygen  Post-op Assessment: Report given to RN and Post -op Vital signs reviewed and stable  Post vital signs: Reviewed and stable  Last Vitals:  Vitals Value Taken Time  BP 105/58 01/29/20 1402  Temp    Pulse 106 01/29/20 1405  Resp 23 01/29/20 1405  SpO2 98 % 01/29/20 1405  Vitals shown include unvalidated device data.  Last Pain:  Vitals:   01/29/20 1403  TempSrc:   PainSc: (P) Asleep         Complications: No complications documented.

## 2020-01-29 NOTE — Progress Notes (Signed)
On hold, room needs to be finished cleaning.

## 2020-01-29 NOTE — Progress Notes (Signed)
Dr. Windell Moment  And Dr. Lubertha Basque aware of pt PT and INR elevated prior to surgery.

## 2020-01-30 LAB — CBC
HCT: 39.6 % (ref 39.0–52.0)
Hemoglobin: 13.9 g/dL (ref 13.0–17.0)
MCH: 33 pg (ref 26.0–34.0)
MCHC: 35.1 g/dL (ref 30.0–36.0)
MCV: 94.1 fL (ref 80.0–100.0)
Platelets: 109 10*3/uL — ABNORMAL LOW (ref 150–400)
RBC: 4.21 MIL/uL — ABNORMAL LOW (ref 4.22–5.81)
RDW: 13.4 % (ref 11.5–15.5)
WBC: 14.5 10*3/uL — ABNORMAL HIGH (ref 4.0–10.5)
nRBC: 0 % (ref 0.0–0.2)

## 2020-01-30 LAB — MAGNESIUM: Magnesium: 1.9 mg/dL (ref 1.7–2.4)

## 2020-01-30 LAB — PHOSPHORUS: Phosphorus: 2.9 mg/dL (ref 2.5–4.6)

## 2020-01-30 LAB — BASIC METABOLIC PANEL
Anion gap: 8 (ref 5–15)
BUN: 12 mg/dL (ref 6–20)
CO2: 23 mmol/L (ref 22–32)
Calcium: 7.9 mg/dL — ABNORMAL LOW (ref 8.9–10.3)
Chloride: 103 mmol/L (ref 98–111)
Creatinine, Ser: 0.69 mg/dL (ref 0.61–1.24)
GFR calc Af Amer: >60 mL/min (ref >60)
GFR calc non Af Amer: >60 mL/min (ref >60)
Glucose, Bld: 109 mg/dL — ABNORMAL HIGH (ref 70–99)
Potassium: 3.9 mmol/L (ref 3.5–5.1)
Sodium: 134 mmol/L — ABNORMAL LOW (ref 135–145)

## 2020-01-30 MED ORDER — CHLORHEXIDINE GLUCONATE CLOTH 2 % EX PADS: 6.0000 | MEDICATED_PAD | Freq: Every day | CUTANEOUS | Status: DC

## 2020-01-30 NOTE — Progress Notes (Signed)
Patient ID: Timothy Goodman, male   DOB: 1961/02/02, 59 y.o.   MRN: [id number]     [facility] Day(s): 1.   Post op day(s): 1 Day Post-Op.   Interval History: Patient seen and examined, no acute events or new complaints overnight. Patient reports feeling ok. He reports pain controlled with current pain medication. He denies nausea. Reports passing gas since yesterday at 9:30 PM.   Vital signs in last 24 hours: [min-max] current  Temp:  [97.6 F (36.4 C)-98.6 F (37 C)] 98.6 F (37 C) (06/16 0500) Pulse Rate:  [74-111] 89 (06/16 0500) Resp:  [13-27] 16 (06/16 0500) BP: (105-138)/(58-95) 138/76 (06/16 0500) SpO2:  [95 %-99 %] 98 % (06/16 0500)     Height: 5\' 6"  (167.6 cm)       Physical Exam:  Constitutional: alert, cooperative and no distress  Respiratory: breathing non-labored at rest  Cardiovascular: regular rate and sinus rhythm  Gastrointestinal: soft, non-tender, and non-distended. Wounds are dry and clean.   Labs:  CBC Latest Ref Rng & Units 01/29/2020 02/29/2016  WBC 4.0 - 10.5 K/uL 10.0 11.4(H)  Hemoglobin 13.0 - 17.0 g/dL 14.9 14.9  Hematocrit 39 - 52 % 43.0 42.3  Platelets 150 - 400 K/uL 117(L) 208   CMP Latest Ref Rng & Units 01/29/2020 01/08/2020 02/29/2016  Glucose 65 - 99 mg/dL - - 122(H)  BUN 6 - 20 mg/dL - - 5(L)  Creatinine 0.61 - 1.24 mg/dL 1.07 0.80 0.57(L)  Sodium 135 - 145 mmol/L - - 137  Potassium 3.5 - 5.1 mmol/L - - 3.5  Chloride 101 - 111 mmol/L - - 106  CO2 22 - 32 mmol/L - - 22  Calcium 8.9 - 10.3 mg/dL - - 8.2(L)  Total Protein 6.5 - 8.1 g/dL - - 8.8(H)  Total Bilirubin 0.3 - 1.2 mg/dL - - 0.8  Alkaline Phos 38 - 126 U/L - - 146(H)  AST 15 - 41 U/L - - 88(H)  ALT 17 - 63 U/L - - 29    Imaging studies: No new pertinent imaging studies   Assessment/Plan:  59 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  Patient recovering adequately. Passing gas since last night. Will discontinue foley. Encourage to ambulate. Will advance diet to full liquids. Will continue DVT prophylaxis. Will continue with pain controlled.   [name], MD

## 2020-01-30 NOTE — Progress Notes (Signed)
   01/30/20 0800  Clinical Encounter Type  Visited With Patient  Visit Type Follow-up  Referral From Chaplain  Consult/Referral To Chaplain  Chaplain visited with patient. Patient said that he is feeling much better. He described how he is feeling today in comparison to yesterday. Patient said his pain was 8 yesterday and this morning it is 4. Patient will take in liquids today. He is preparing to have jello. Patient is in very good spirits. Chaplain will follow up.

## 2020-01-31 MED ORDER — HYDROCODONE-ACETAMINOPHEN 5-325 MG PO TABS: 1.0000 | ORAL_TABLET | ORAL | 0 refills | Status: AC | PRN

## 2020-01-31 NOTE — Discharge Instructions (Signed)
  Diet: Resume soft low fiber diet for 2 weeks. After two weeks, start regular high fiber diet. It is preferred to have frequent small meals than having 3 big meals a day. Drink at least 8 glasses of water daily.  ? ?Activity: No heavy lifting >10 pounds (children, pets, laundry, garbage) or strenuous activity for the next 6 weeks, but light activity and walking are encouraged.  ? ?It is normal to feel tire most of the time since your body is using energy to heal.   ? ?Wound care: May shower with soapy water and pat dry (do not rub incisions), but no baths or submerging incision underwater until follow-up. (no swimming)  ? ?Do not smoke, since smoking delays the process of healing, among other negative effects.  ? ?Call the office if the wound becomes red and start to drain pus.  ? ?Medications: Resume all home medications. For mild to moderate pain: acetaminophen (Tylenol) or ibuprofen (if no kidney disease). Combining Tylenol with alcohol can substantially increase your risk of causing liver disease. Narcotic pain medications, if prescribed, can be used for severe pain, though may cause nausea, constipation, and drowsiness. Do not combine Tylenol and Percocet within a 6 hour period as Percocet contains Tylenol. If you do not need the narcotic pain medication, you do not need to fill the prescription. Do not drink alcohol while taking narcotics.  ? ?Call office ([phone]) at any time if any questions, worsening pain, fevers/chills, bleeding, drainage from incision site, or other concerns. ? ?

## 2020-01-31 NOTE — Discharge Summary (Signed)
Physician Discharge Summary  Patient ID: Timothy Goodman MRN: [id number] DOB/AGE: 1960/08/17 59 y.o.  Admit date: 01/29/2020 Discharge date: 01/31/2020  Admission Diagnoses: Transverse colon cancer  Discharge Diagnoses:  Active Problems:   Colon cancer [facility])   Discharged Condition: good  Hospital Course: Patient underwent robotic assisted laparoscopic right extended hemicolectomy.  He tolerated procedure well.  He has been ambulating, voiding spontaneously.  He has been passing gas and having bowel movement.  He has tolerated clear liquid, full liquid and soft diet.  The pain is controlled with current pain medications.  There has been no nausea or vomiting.  Wounds are healing well.  Consults: None  Treatments: surgery: Robotic assisted laparoscopic extended right hemicolectomy   Discharge Exam: Blood pressure 132/75, pulse 67, temperature 97.9 F (36.6 C), temperature source Oral, resp. rate 16, height 5\' 6"  (1.676 m), SpO2 98 %. Physical Exam Constitutional:      Appearance: Normal appearance.  Cardiovascular:     Rate and Rhythm: Normal rate and regular rhythm.  Pulmonary:     Effort: Pulmonary effort is normal.     Breath sounds: Normal breath sounds.  Abdominal:     General: Abdomen is flat. Bowel sounds are normal. There is no distension.     Palpations: Abdomen is soft.     Tenderness: There is no abdominal tenderness. There is no guarding.  Neurological:     Mental Status: He is alert and oriented to person, place, and time.   Wounds are dry and clean   Disposition: Discharge disposition: 01-Home or Self Care       Discharge Instructions    Diet - low sodium heart healthy   Complete by: As directed    Increase activity slowly   Complete by: As directed      Allergies as of 01/31/2020   No Known Allergies     Medication List    TAKE these medications   HERBAL ENERGY COMPLEX PO Take 3 tablets by mouth daily.   HYDROcodone-acetaminophen  5-325 MG tablet Commonly known as: Norco Take 1 tablet by mouth every 4 (four) hours as needed for up to 3 days for moderate pain.       Follow-up Information    Herbert Pun, MD Follow up in 2 week(s).   Specialty: General Surgery Contact information: [place] [address] [phone]               Signed: Herbert Pun 01/31/2020, 10:44 AM

## 2020-01-31 NOTE — Progress Notes (Signed)
Timothy Goodman to be D/C'd home per MD order.  Discussed prescriptions and follow up appointments with the patient. Prescriptions given to patient, medication list explained in detail. Pt verbalized understanding.  Allergies as of 01/31/2020   No Known Allergies      Medication List     TAKE these medications    HERBAL ENERGY COMPLEX PO Take 3 tablets by mouth daily.   HYDROcodone-acetaminophen 5-325 MG tablet Commonly known as: Norco Take 1 tablet by mouth every 4 (four) hours as needed for up to 3 days for moderate pain.        Vitals:   01/30/20 2041 01/31/20 0449  BP: 126/80 132/75  Pulse: 65 67  Resp: 16 16  Temp: 98.2 F (36.8 C) 97.9 F (36.6 C)  SpO2: 99% 98%    Skin clean, dry and intact without evidence of skin break down, no evidence of skin tears noted. IV catheter discontinued intact. Site without signs and symptoms of complications. Dressing and pressure applied. Pt denies pain at this time. No complaints noted.  An After Visit Summary was printed and given to the patient. Patient escorted via [place], and D/C home via private auto.  [place] A Sia Gabrielsen

## 2020-01-31 NOTE — Anesthesia Postprocedure Evaluation (Signed)
Anesthesia Post Note  Patient: Timothy Goodman  Procedure(s) Performed: XI ROBOT ASSISTED LAPAROSCOPIC PARTIAL COLECTOMY (N/A Abdomen) INDOCYANINE GREEN FLUORESCENCE IMAGING (ICG)  Patient location during evaluation: PACU Anesthesia Type: General Level of consciousness: awake and alert Pain management: pain level controlled Vital Signs Assessment: post-procedure vital signs reviewed and stable Respiratory status: spontaneous breathing, nonlabored ventilation, respiratory function stable and patient connected to nasal cannula oxygen Cardiovascular status: blood pressure returned to baseline and stable Postop Assessment: no apparent nausea or vomiting Anesthetic complications: no   No complications documented.   Last Vitals:  Vitals:   01/31/20 0449 01/31/20 1235  BP: 132/75 103/66  Pulse: 67 73  Resp: 16 15  Temp: 36.6 C 36.8 C  SpO2: 98% 98%    Last Pain:  Vitals:   01/31/20 1105  TempSrc:   PainSc: 2                  Molli Barrows

## 2020-02-05 LAB — SURGICAL PATHOLOGY

## 2020-02-25 ENCOUNTER — Encounter: Payer: Self-pay | Admitting: Oncology

## 2020-02-25 ENCOUNTER — Inpatient Hospital Stay: Payer: 59

## 2020-02-25 ENCOUNTER — Other Ambulatory Visit: Payer: Self-pay

## 2020-02-25 ENCOUNTER — Inpatient Hospital Stay: Payer: 59 | Attending: Oncology | Admitting: Oncology

## 2020-02-25 VITALS — BP 99/63 | HR 76 | Temp 96.6°F | Resp 18 | Wt 169.6 lb

## 2020-02-25 DIAGNOSIS — C184 Malignant neoplasm of transverse colon: Secondary | ICD-10-CM | POA: Diagnosis not present

## 2020-02-25 DIAGNOSIS — K7030 Alcoholic cirrhosis of liver without ascites: Secondary | ICD-10-CM | POA: Diagnosis not present

## 2020-02-25 DIAGNOSIS — F17210 Nicotine dependence, cigarettes, uncomplicated: Secondary | ICD-10-CM | POA: Insufficient documentation

## 2020-02-25 DIAGNOSIS — D696 Thrombocytopenia, unspecified: Secondary | ICD-10-CM | POA: Diagnosis not present

## 2020-02-25 DIAGNOSIS — Z7189 Other specified counseling: Secondary | ICD-10-CM | POA: Insufficient documentation

## 2020-02-25 DIAGNOSIS — Z9049 Acquired absence of other specified parts of digestive tract: Secondary | ICD-10-CM | POA: Diagnosis not present

## 2020-02-25 NOTE — Progress Notes (Signed)
Patient here for follow up. No concerns voiced.  °

## 2020-02-25 NOTE — Progress Notes (Signed)
Hematology/Oncology Consult note [facility] Telephone:([phone] [phone]   Patient Care Team: Patient, No Pcp Per as PCP - General (General Practice) Clent Jacks, RN as Oncology Nurse Navigator  REFERRING PROVIDER: Geanie Kenning, [place]*  CHIEF COMPLAINTS/REASON FOR VISIT:  Evaluation of colon cancer  HISTORY OF PRESENTING ILLNESS:   Timothy Goodman is a  59 y.o.  male with PMH listed below was seen in consultation at the request of  Geanie Kenning, [place]*  for evaluation of colon cancer 01/02/2020 screening colonoscopy showed 5 mm polyp in the proximal transverse colon, removed and retrieved.  Mucosal ulceration biopsied and tattooed. Biopsy pathology showed cecum polyp is tubular adenoma.  Negative for high-grade dysplasia and malignancy. 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 Transverse colon polyp showed hyperplastic polyp.  Deeper sections examined.  Negative for dysplasia and malignancy. 01/08/2020, abdomen CT chest abdomen pelvis with contrast showed no distant metastasis. Patient was seen and evaluated by Dr. Peyton Najjar and was recommended for surgical resection. Preop CEA was 9.1, lab was done at [facility] clinic.  01/29/2020, patient underwent right hemicolectomy. 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  pT1 pN0,   Grade 1, well-differentiated, all margins were uninvolved by invasive carcinoma, high-grade dysplasia/intramucosal adenocarcinoma and low-grade dysplastic.  Patient was referred to establish care with oncology for evaluation. Patient reports feeling well.  Occasionally complains of soreness when he changes position from sitting to standing.  Otherwise doing well.  Denies any black or bloody stool. He denies any family history of cancer.  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  He admits to continue drink alcohol intermittently/on occasions.  Denies any bleeding events.  Review of Systems  Constitutional: Negative for appetite change, chills, fatigue, fever and unexpected weight change.  HENT:   Negative for hearing loss and voice change.   Eyes: Negative for eye problems and icterus.  Respiratory: Negative for chest tightness, cough and shortness of breath.   Cardiovascular: Negative for chest pain and leg swelling.  Gastrointestinal: Negative for abdominal distention and abdominal pain.  Endocrine: Negative for hot flashes.  Genitourinary: Negative for difficulty urinating, dysuria and frequency.   Musculoskeletal: Negative for arthralgias.  Skin: Negative for itching and rash.  Neurological: Negative for light-headedness and numbness.  Hematological: Negative for adenopathy. Does not bruise/bleed easily.  Psychiatric/Behavioral: Negative for confusion.    MEDICAL HISTORY:  Past Medical History:  Diagnosis Date  . 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 Cirrhosis of liver ([place])   . Thrombocytopenia ([place])     SURGICAL HISTORY: Past Surgical History:  Procedure Laterality Date  . COLONOSCOPY WITH PROPOFOL N/A 01/02/2020   Procedure: COLONOSCOPY WITH PROPOFOL;  Surgeon: Toledo, Benay Pike, MD;  Location: [facility] ENDOSCOPY;  Service: Gastroenterology;  Laterality: N/A;  . ESOPHAGOGASTRODUODENOSCOPY (EGD) WITH PROPOFOL N/A 01/02/2020   Procedure: ESOPHAGOGASTRODUODENOSCOPY (EGD) WITH PROPOFOL;  Surgeon: Toledo, Benay Pike, MD;  Location: [facility] ENDOSCOPY;  Service: Gastroenterology;  Laterality: N/A;  . laparascopic colectomy  01/29/2020    SOCIAL HISTORY: Social History   Socioeconomic History  . Marital status: Married    Spouse name: Not on file  . Number of children: Not on file  . Years of education: Not on file  . Highest education level: Not on file  Occupational History  . Not on file  Tobacco Use  . Smoking status:  Current Some Day  Smoker    Packs/day: 0.00    Types: Cigarettes  . Smokeless tobacco: Never Used  . Tobacco comment: occasional smoking  Vaping Use  . Vaping Use: Never used  Substance and Sexual Activity  . Alcohol use: Yes    Alcohol/week: 5.0 standard drinks    Types: 5 Cans of beer per week    Comment: 5 cans per week  . Drug use: No  . Sexual activity: Not on file  Other Topics Concern  . Not on file  Social History Narrative  . Not on file   Social Determinants of Health   Financial Resource Strain:   . Difficulty of Paying Living Expenses:   Food Insecurity:   . Worried About Charity fundraiser in the Last Year:   . Arboriculturist in the Last Year:   Transportation Needs:   . Film/video editor (Medical):   Marland Kitchen Lack of Transportation (Non-Medical):   Physical Activity:   . Days of Exercise per Week:   . Minutes of Exercise per Session:   Stress:   . Feeling of Stress :   Social Connections:   . Frequency of Communication with Friends and Family:   . Frequency of Social Gatherings with Friends and Family:   . Attends Religious Services:   . Active Member of Clubs or Organizations:   . Attends Archivist Meetings:   Marland Kitchen Marital Status:   Intimate Partner Violence:   . Fear of Current or Ex-Partner:   . Emotionally Abused:   Marland Kitchen Physically Abused:   . Sexually Abused:     FAMILY HISTORY: Family History  Problem Relation Age of Onset  . Cirrhosis Father     ALLERGIES:  has No Known Allergies.  MEDICATIONS:  Current Outpatient Medications  Medication Sig Dispense Refill  . HYDROcodone-acetaminophen (NORCO/VICODIN) 5-325 MG tablet as needed.    . lactulose (CHRONULAC) 10 GM/15ML solution Take 10 g by mouth 2 (two) times daily.    . Misc Natural Products (HERBAL ENERGY COMPLEX PO) Take 3 tablets by mouth daily. (Patient not taking: Reported on 02/25/2020)     No current facility-administered medications for this visit.     PHYSICAL  EXAMINATION: ECOG PERFORMANCE STATUS: 0 - Asymptomatic Vitals:   02/25/20 1125  BP: 99/63  Pulse: 76  Resp: 18  Temp: (!) 96.6 F (35.9 C)   Filed Weights   02/25/20 1125  Weight: 169 lb 9.6 oz (76.9 kg)    Physical Exam Constitutional:      General: He is not in acute distress. HENT:     Head: Normocephalic and atraumatic.  Eyes:     General: No scleral icterus. Cardiovascular:     Rate and Rhythm: Normal rate and regular rhythm.     Heart sounds: Normal heart sounds.  Pulmonary:     Effort: Pulmonary effort is normal. No respiratory distress.     Breath sounds: No wheezing.  Abdominal:     General: Bowel sounds are normal. There is no distension.     Palpations: Abdomen is soft.  Musculoskeletal:        General: No deformity. Normal range of motion.     Cervical back: Normal range of motion and neck supple.  Skin:    General: Skin is warm and dry.     Findings: No erythema or rash.  Neurological:     Mental Status: He is alert and oriented to person, place, and time. Mental status is at baseline.  Cranial Nerves: No cranial nerve deficit.     Coordination: Coordination normal.  Psychiatric:        Mood and Affect: Mood normal.     LABORATORY DATA:  I have reviewed the data as listed Lab Results  Component Value Date   WBC 14.5 (H) 01/30/2020   HGB 13.9 01/30/2020   HCT 39.6 01/30/2020   MCV 94.1 01/30/2020   PLT 109 (L) 01/30/2020   Recent Labs    01/08/20 1315 01/29/20 1639 01/30/20 0811  NA  --   --  134*  K  --   --  3.9  CL  --   --  103  CO2  --   --  23  GLUCOSE  --   --  109*  BUN  --   --  12  CREATININE 0.80 1.07 0.69  CALCIUM  --   --  7.9*  GFRNONAA  --  >60 >60  GFRAA  --  >60 >60   Iron/TIBC/Ferritin/ %Sat No results found for: IRON, TIBC, FERRITIN, IRONPCTSAT    RADIOGRAPHIC STUDIES: I have personally reviewed the radiological images as listed and agreed with the findings in the report. CT CHEST W CONTRAST  Result  Date: 01/08/2020 CLINICAL DATA:  New diagnosis of transverse colon cancer. EXAM: CT CHEST, ABDOMEN, AND PELVIS WITH CONTRAST TECHNIQUE: Multidetector CT imaging of the chest, abdomen and pelvis was performed following the standard protocol during bolus administration of intravenous contrast. CONTRAST:  115mL OMNIPAQUE IOHEXOL 300 MG/ML  SOLN COMPARISON:  03/01/2016 CT abdomen/pelvis. FINDINGS: CT CHEST FINDINGS Cardiovascular: Normal heart size. No significant pericardial effusion/thickening. Atherosclerotic nonaneurysmal thoracic aorta. Normal caliber pulmonary arteries. No central pulmonary emboli. Mediastinum/Nodes: No discrete thyroid nodules. Unremarkable esophagus. No pathologically enlarged axillary, mediastinal or hilar lymph nodes. Lungs/Pleura: No pneumothorax. No pleural effusion. No acute consolidative airspace disease, lung masses or significant pulmonary nodules. Musculoskeletal: No aggressive appearing focal osseous lesions. Mild thoracic spondylosis. CT ABDOMEN PELVIS FINDINGS Hepatobiliary: Diffusely irregular liver surface compatible with cirrhosis. No liver masses. Normal gallbladder with no radiopaque cholelithiasis. No biliary ductal dilatation. Pancreas: Normal, with no mass or duct dilation. Spleen: Normal size. No mass. Adrenals/Urinary Tract: No discrete adrenal nodules. Normal kidneys with no hydronephrosis and no renal mass. Normal bladder. Stomach/Bowel: Small hiatal hernia. Otherwise normal nondistended stomach. Normal caliber small bowel with no small bowel wall thickening. Normal appendix. Oral contrast transits to the right colon. No discrete colonic mass or definite large bowel wall thickening. No significant colonic diverticulosis or pericolonic fat stranding. Vascular/Lymphatic: Atherosclerotic nonaneurysmal abdominal aorta. Patent portal, splenic, hepatic and renal veins. No pathologically enlarged lymph nodes in the abdomen or pelvis. Reproductive: Normal size prostate. Other: No  pneumoperitoneum, ascites or focal fluid collection. Musculoskeletal: No aggressive appearing focal osseous lesions. Mild lumbar spondylosis. IMPRESSION: 1. No evidence of metastatic disease in the chest, abdomen or pelvis. No discrete colonic mass by CT. 2. Hepatic cirrhosis. 3. Small hiatal hernia. 4. Aortic Atherosclerosis (ICD10-I70.0). Electronically Signed   By: Ilona Sorrel M.D.   On: 01/08/2020 14:02   CT ABDOMEN PELVIS W CONTRAST  Result Date: 01/08/2020 CLINICAL DATA:  New diagnosis of transverse colon cancer. EXAM: CT CHEST, ABDOMEN, AND PELVIS WITH CONTRAST TECHNIQUE: Multidetector CT imaging of the chest, abdomen and pelvis was performed following the standard protocol during bolus administration of intravenous contrast. CONTRAST:  142mL OMNIPAQUE IOHEXOL 300 MG/ML  SOLN COMPARISON:  03/01/2016 CT abdomen/pelvis. FINDINGS: CT CHEST FINDINGS Cardiovascular: Normal heart size. No significant pericardial effusion/thickening. Atherosclerotic nonaneurysmal thoracic  aorta. Normal caliber pulmonary arteries. No central pulmonary emboli. Mediastinum/Nodes: No discrete thyroid nodules. Unremarkable esophagus. No pathologically enlarged axillary, mediastinal or hilar lymph nodes. Lungs/Pleura: No pneumothorax. No pleural effusion. No acute consolidative airspace disease, lung masses or significant pulmonary nodules. Musculoskeletal: No aggressive appearing focal osseous lesions. Mild thoracic spondylosis. CT ABDOMEN PELVIS FINDINGS Hepatobiliary: Diffusely irregular liver surface compatible with cirrhosis. No liver masses. Normal gallbladder with no radiopaque cholelithiasis. No biliary ductal dilatation. Pancreas: Normal, with no mass or duct dilation. Spleen: Normal size. No mass. Adrenals/Urinary Tract: No discrete adrenal nodules. Normal kidneys with no hydronephrosis and no renal mass. Normal bladder. Stomach/Bowel: Small hiatal hernia. Otherwise normal nondistended stomach. Normal caliber small bowel  with no small bowel wall thickening. Normal appendix. Oral contrast transits to the right colon. No discrete colonic mass or definite large bowel wall thickening. No significant colonic diverticulosis or pericolonic fat stranding. Vascular/Lymphatic: Atherosclerotic nonaneurysmal abdominal aorta. Patent portal, splenic, hepatic and renal veins. No pathologically enlarged lymph nodes in the abdomen or pelvis. Reproductive: Normal size prostate. Other: No pneumoperitoneum, ascites or focal fluid collection. Musculoskeletal: No aggressive appearing focal osseous lesions. Mild lumbar spondylosis. IMPRESSION: 1. No evidence of metastatic disease in the chest, abdomen or pelvis. No discrete colonic mass by CT. 2. Hepatic cirrhosis. 3. Small hiatal hernia. 4. Aortic Atherosclerosis (ICD10-I70.0). Electronically Signed   By: Ilona Sorrel M.D.   On: 01/08/2020 14:02      ASSESSMENT & PLAN:  1. Malignant neoplasm of transverse colon ([place])   2. Goals of care, counseling/discussion   3. Thrombocytopenia ([place])   4. Alcoholic cirrhosis of liver without ascites ([facility])    Stage I transverse colon invasive carcinoma, pT1 pN0 cM0 CT images were independently reviewed by me and discussed with the patient .  Labs are reviewed and discussed with patient.  He has a preop CEA of 9.1. No adjuvant chemotherapy is needed. Recommend patient to complete colonoscopy 1 year after surgery. I will repeat CBC, CMP, CEA in 6 months. Goal of care, curative.  Discussed  Thrombocytopenia, chronic, likely secondary to liver cirrhosis.  Continue to monitor Check immature platelet fraction at the next visit. Check vitamin B12, folate level at the next visit.  Chronic alcohol use, in the context of cirrhosis, I discussed with patient about importance of complete alcohol cessation. Continue follow-up with gastroenterology for labs and [facility] surveillance.  Spanish interpreter was present for the entire encounter for  interpreting Orders Placed This Encounter  Procedures  . CBC with Differential/Platelet    Standing Status:   Future    Standing Expiration Date:   02/24/2021  . Comprehensive metabolic panel    Standing Status:   Future    Standing Expiration Date:   02/24/2021  . CEA    Standing Status:   Future    Standing Expiration Date:   02/24/2021    All questions were answered. The patient knows to call the clinic with any problems questions or concerns.  cc Geanie Kenning, [place]*    Return of visit: 6 months Thank you for this kind referral and the opportunity to participate in the care of this patient. A copy of today's note is routed to referring provider    Earlie Server, MD, PhD Hematology Oncology [facility] at [facility] Pager- [phone] 02/25/2020

## 2020-07-02 IMAGING — CT CT CHEST W/ CM
3 of 5 series · 14 of 36 positions shown, 17 images · IV contrast (omnipaque)
Comparison: 03/01/2016 CT abdomen/pelvis.

CLINICAL DATA: New diagnosis of transverse colon cancer.

EXAM:
CT CHEST, ABDOMEN, AND PELVIS WITH CONTRAST
TECHNIQUE: Multidetector CT imaging of the chest, abdomen and pelvis was
performed following the standard protocol during bolus
administration of intravenous contrast.
CONTRAST:  100mL OMNIPAQUE IOHEXOL 300 MG/ML  SOLN

[Series 3: cap with · axial · 0.76mm/px · z∈[-84,+401]mm · 9 of 123 slices shown, 12 images]
[im 13/123  mediastinal]
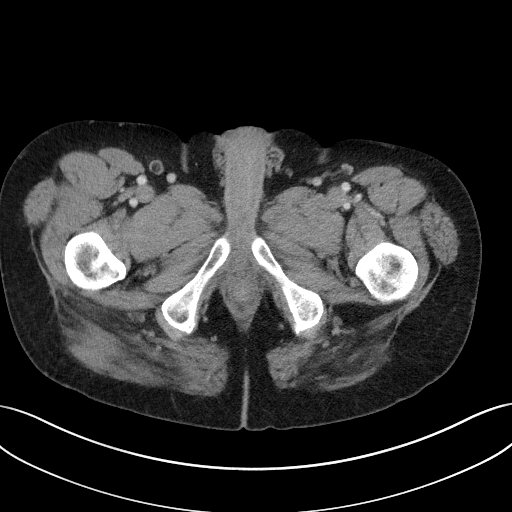
[im 13/123  lung]
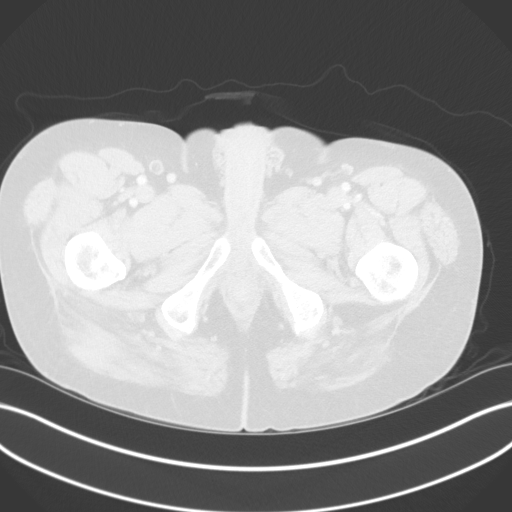
[im 25/123  lung]
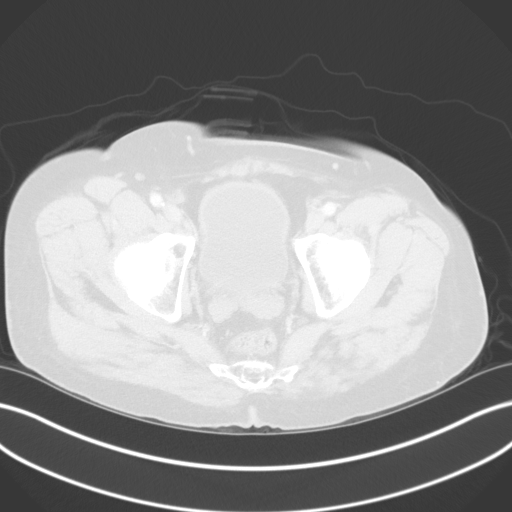
[im 37/123  lung]
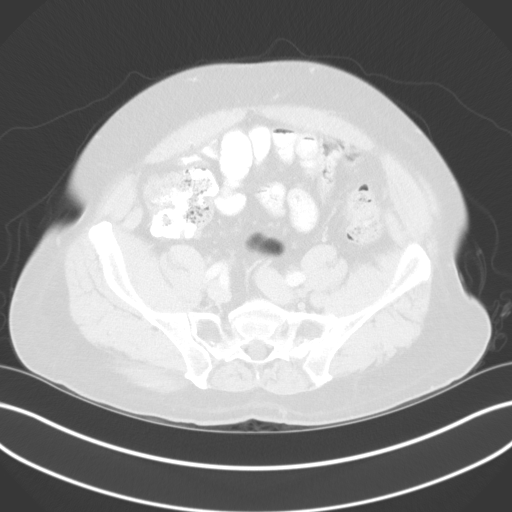
[im 49/123  lung]
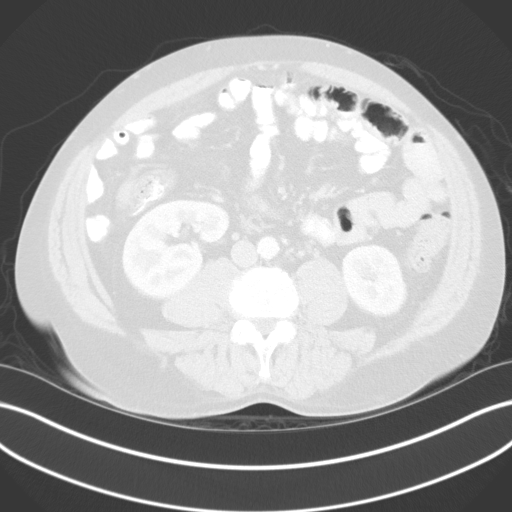
[im 62/123  mediastinal]
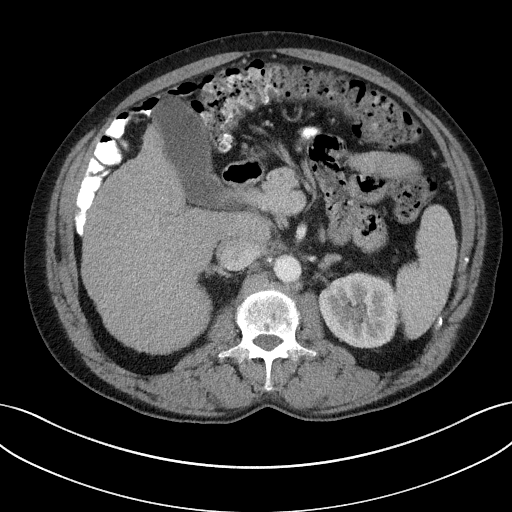
[im 62/123  lung]
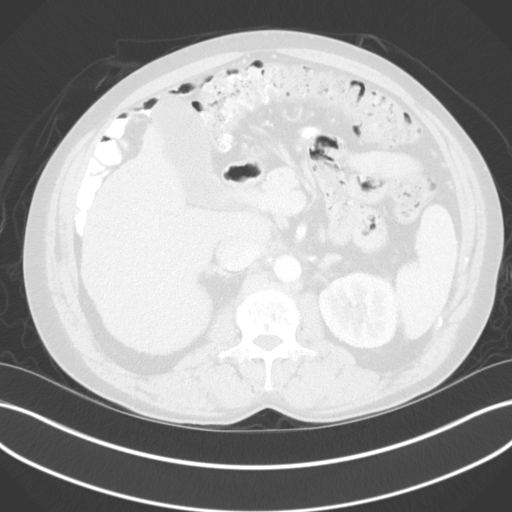
[im 74/123  lung]
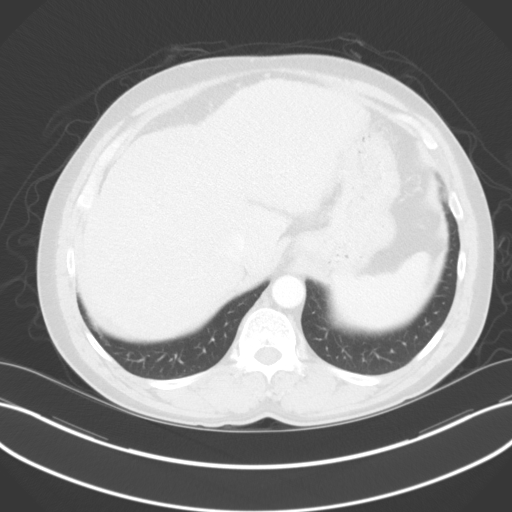
[im 86/123  lung]
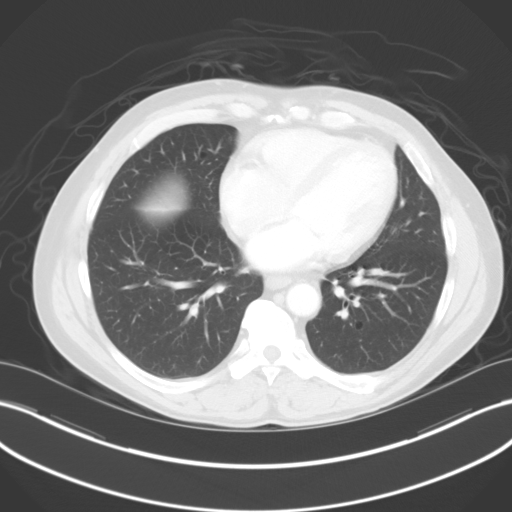
[im 98/123  lung]
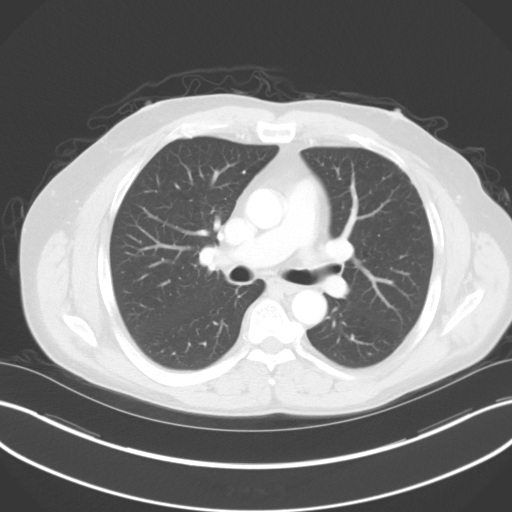
[im 110/123  mediastinal]
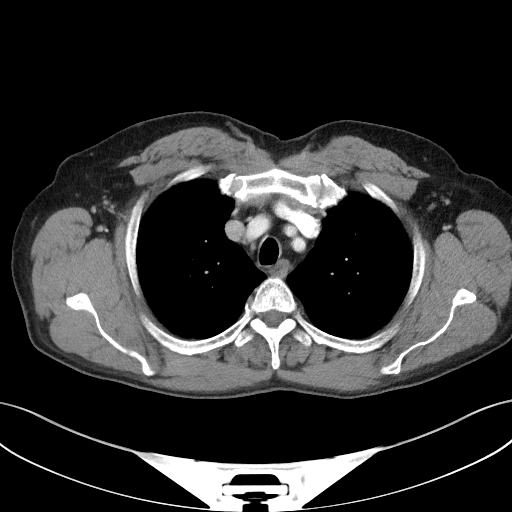
[im 110/123  lung]
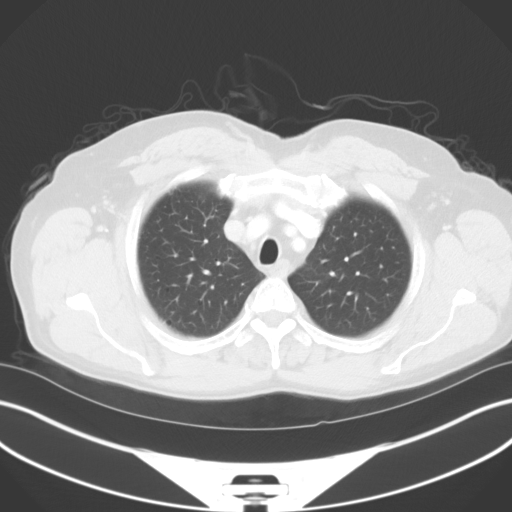

[Series 5: lung · axial · 0.76mm/px · z∈[+206,+254]mm · 2 of 142 slices shown]
[im 12/142  lung]
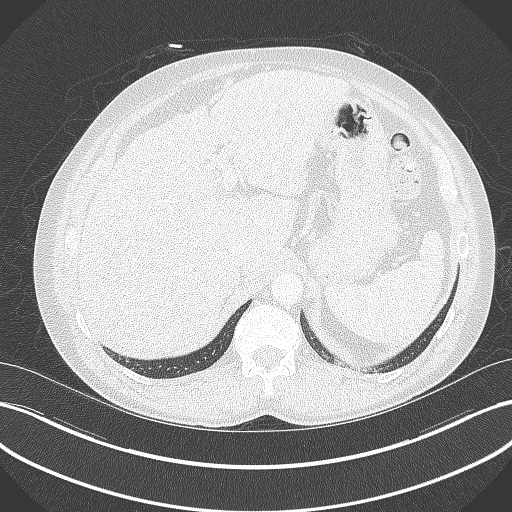
[im 36/142  lung]
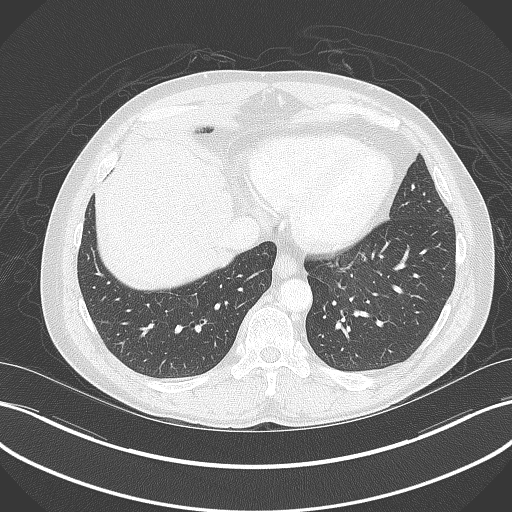

[Series 6: coronals · coronal · 0.79mm/px · 3 of 155 slices shown]
[im 31/155  lung]
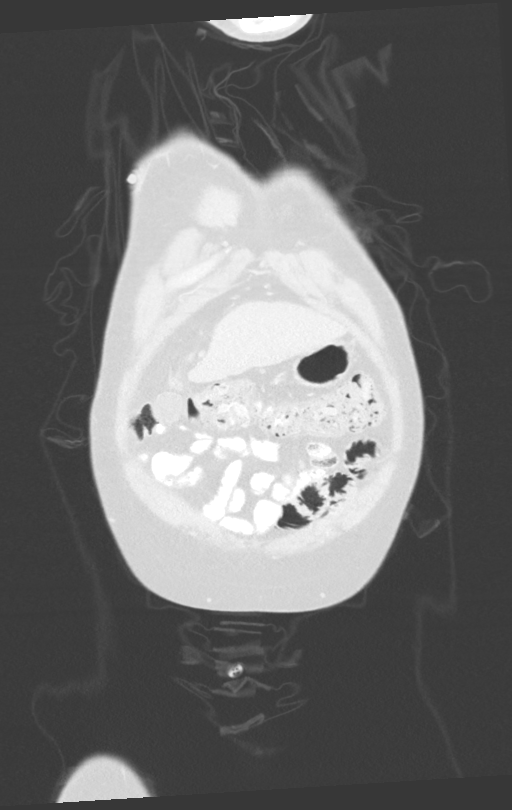
[im 62/155  lung]
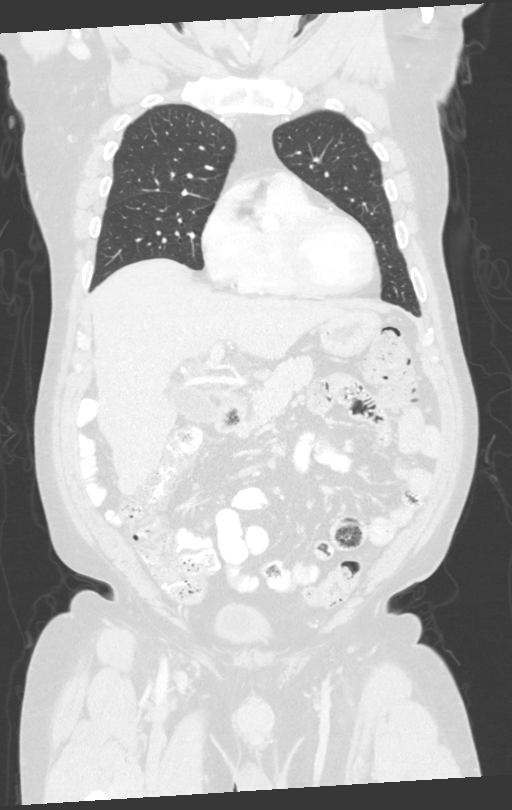
[im 93/155  lung]
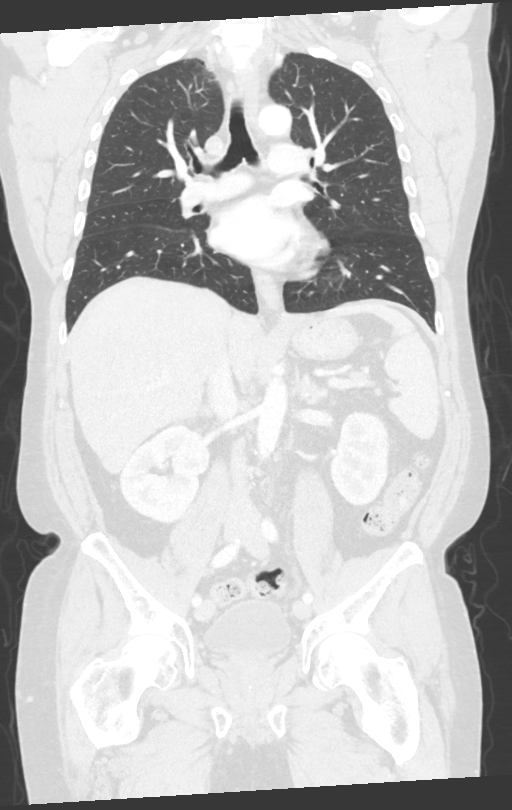

[14 of 36 positions shown; findings below may reference images not displayed]

FINDINGS: CT CHEST FINDINGS

Cardiovascular: Normal heart size. No significant pericardial
effusion/thickening. Atherosclerotic nonaneurysmal thoracic aorta.
Normal caliber pulmonary arteries. No central pulmonary emboli.

Mediastinum/Nodes: No discrete thyroid nodules. Unremarkable
esophagus. No pathologically enlarged axillary, mediastinal or hilar
lymph nodes.

Lungs/Pleura: No pneumothorax. No pleural effusion. No acute
consolidative airspace disease, lung masses or significant pulmonary
nodules.

Musculoskeletal: No aggressive appearing focal osseous lesions. Mild
thoracic spondylosis.

CT ABDOMEN PELVIS FINDINGS

Hepatobiliary: Diffusely irregular liver surface compatible with
cirrhosis. No liver masses. Normal gallbladder with no radiopaque
cholelithiasis. No biliary ductal dilatation.

Pancreas: Normal, with no mass or duct dilation.

Spleen: Normal size. No mass.

Adrenals/Urinary Tract: No discrete adrenal nodules. Normal kidneys
with no hydronephrosis and no renal mass. Normal bladder.

Stomach/Bowel: Small hiatal hernia. Otherwise normal nondistended
stomach. Normal caliber small bowel with no small bowel wall
thickening. Normal appendix. Oral contrast transits to the right
colon. No discrete colonic mass or definite large bowel wall
thickening. No significant colonic diverticulosis or pericolonic fat
stranding.

Vascular/Lymphatic: Atherosclerotic nonaneurysmal abdominal aorta.
Patent portal, splenic, hepatic and renal veins. No pathologically
enlarged lymph nodes in the abdomen or pelvis.

Reproductive: Normal size prostate.

Other: No pneumoperitoneum, ascites or focal fluid collection.

Musculoskeletal: No aggressive appearing focal osseous lesions. Mild
lumbar spondylosis.
IMPRESSION: 1. No evidence of metastatic disease in the chest, abdomen or
pelvis. No discrete colonic mass by CT.
2. Hepatic cirrhosis.
3. Small hiatal hernia.
4. Aortic Atherosclerosis ([id number]).

## 2020-08-25 ENCOUNTER — Inpatient Hospital Stay: Payer: Self-pay

## 2020-08-25 ENCOUNTER — Inpatient Hospital Stay: Payer: Self-pay | Admitting: Oncology

## 2020-09-10 ENCOUNTER — Encounter (INDEPENDENT_AMBULATORY_CARE_PROVIDER_SITE_OTHER): Payer: Self-pay

## 2020-09-10 ENCOUNTER — Inpatient Hospital Stay: Payer: Self-pay | Attending: Oncology

## 2020-09-10 ENCOUNTER — Encounter: Payer: Self-pay | Admitting: Oncology

## 2020-09-10 ENCOUNTER — Inpatient Hospital Stay (HOSPITAL_BASED_OUTPATIENT_CLINIC_OR_DEPARTMENT_OTHER): Payer: Self-pay | Admitting: Oncology

## 2020-09-10 VITALS — BP 147/84 | HR 83 | Temp 98.8°F | Resp 18 | Wt 170.1 lb

## 2020-09-10 DIAGNOSIS — D696 Thrombocytopenia, unspecified: Secondary | ICD-10-CM

## 2020-09-10 DIAGNOSIS — C184 Malignant neoplasm of transverse colon: Secondary | ICD-10-CM

## 2020-09-10 DIAGNOSIS — Z85038 Personal history of other malignant neoplasm of large intestine: Secondary | ICD-10-CM | POA: Insufficient documentation

## 2020-09-10 DIAGNOSIS — K7030 Alcoholic cirrhosis of liver without ascites: Secondary | ICD-10-CM

## 2020-09-10 DIAGNOSIS — Z7189 Other specified counseling: Secondary | ICD-10-CM

## 2020-09-10 DIAGNOSIS — F17210 Nicotine dependence, cigarettes, uncomplicated: Secondary | ICD-10-CM | POA: Insufficient documentation

## 2020-09-10 LAB — CBC WITH DIFFERENTIAL/PLATELET
Abs Immature Granulocytes: 0.02 10*3/uL (ref 0.00–0.07)
Basophils Absolute: 0.1 10*3/uL (ref 0.0–0.1)
Basophils Relative: 1 %
Eosinophils Absolute: 0.2 10*3/uL (ref 0.0–0.5)
Eosinophils Relative: 3 %
HCT: 43.2 % (ref 39.0–52.0)
Hemoglobin: 15.4 g/dL (ref 13.0–17.0)
Immature Granulocytes: 0 %
Lymphocytes Relative: 20 %
Lymphs Abs: 1.5 10*3/uL (ref 0.7–4.0)
MCH: 32 pg (ref 26.0–34.0)
MCHC: 35.6 g/dL (ref 30.0–36.0)
MCV: 89.8 fL (ref 80.0–100.0)
Monocytes Absolute: 0.8 10*3/uL (ref 0.1–1.0)
Monocytes Relative: 11 %
Neutro Abs: 4.7 10*3/uL (ref 1.7–7.7)
Neutrophils Relative %: 65 %
Platelets: 109 10*3/uL — ABNORMAL LOW (ref 150–400)
RBC: 4.81 MIL/uL (ref 4.22–5.81)
RDW: 13.6 % (ref 11.5–15.5)
WBC: 7.3 10*3/uL (ref 4.0–10.5)
nRBC: 0 % (ref 0.0–0.2)

## 2020-09-10 LAB — COMPREHENSIVE METABOLIC PANEL
ALT: 25 U/L (ref 0–44)
AST: 48 U/L — ABNORMAL HIGH (ref 15–41)
Albumin: 3.6 g/dL (ref 3.5–5.0)
Alkaline Phosphatase: 109 U/L (ref 38–126)
Anion gap: 7 (ref 5–15)
BUN: 12 mg/dL (ref 6–20)
CO2: 25 mmol/L (ref 22–32)
Calcium: 8.7 mg/dL — ABNORMAL LOW (ref 8.9–10.3)
Chloride: 102 mmol/L (ref 98–111)
Creatinine, Ser: 0.64 mg/dL (ref 0.61–1.24)
GFR, Estimated: >60 mL/min (ref >60)
Glucose, Bld: 101 mg/dL — ABNORMAL HIGH (ref 70–99)
Potassium: 3.5 mmol/L (ref 3.5–5.1)
Sodium: 134 mmol/L — ABNORMAL LOW (ref 135–145)
Total Bilirubin: 1.7 mg/dL — ABNORMAL HIGH (ref 0.3–1.2)
Total Protein: 8.8 g/dL — ABNORMAL HIGH (ref 6.5–8.1)

## 2020-09-10 LAB — FOLATE: Folate: 15.2 ng/mL (ref >5.9)

## 2020-09-10 LAB — VITAMIN B12: Vitamin B-12: 596 pg/mL (ref 180–914)

## 2020-09-10 LAB — IMMATURE PLATELET FRACTION: Immature Platelet Fraction: 5 % (ref 1.2–8.6)

## 2020-09-10 NOTE — Progress Notes (Signed)
Pt here for follow up. No new concerns voiced.   

## 2020-09-10 NOTE — Progress Notes (Signed)
Hematology/Oncology Consult note [facility] Telephone:([phone] [phone]   Patient Care Team: Patient, No Pcp Per as PCP - General (General Practice) Clent Jacks, RN as Oncology Nurse Navigator  REFERRING PROVIDER: No ref. provider found  CHIEF COMPLAINTS/REASON FOR VISIT:  Evaluation of colon cancer  HISTORY OF PRESENTING ILLNESS:   Timothy Goodman is a  60 y.o.  male with PMH listed below was seen in consultation at the request of  No ref. provider found  for evaluation of colon cancer 01/02/2020 screening colonoscopy showed 5 mm polyp in the proximal transverse colon, removed and retrieved.  Mucosal ulceration biopsied and tattooed. Biopsy pathology showed cecum polyp is tubular adenoma.  Negative for high-grade dysplasia and malignancy. 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 Transverse colon polyp showed hyperplastic polyp.  Deeper sections examined.  Negative for dysplasia and malignancy. 01/08/2020, abdomen CT chest abdomen pelvis with contrast showed no distant metastasis. Patient was seen and evaluated by Dr. Peyton Najjar and was recommended for surgical resection. Preop CEA was 9.1, lab was done at [facility] clinic.  01/29/2020, patient underwent right hemicolectomy. 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  pT1 pN0,   Grade 1, well-differentiated, all margins were uninvolved by invasive carcinoma, high-grade dysplasia/intramucosal adenocarcinoma and low-grade dysplastic.  Patient was referred to establish care with oncology for evaluation. Patient reports feeling well.  Occasionally complains of soreness when he changes position from sitting to standing.  Otherwise doing well.  Denies any black or bloody stool. He denies any family history of cancer.  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  He admits to continue drink alcohol intermittently/on occasions.  Denies any bleeding events.   INTERVAL HISTORY Timothy Goodman is a 60 y.o. male who has above history reviewed by me today presents for follow up visit for management of stage I colon cancer.  Problems and complaints are listed below: He has no new complaints. Denies any bowel habit change, bloody or black stool.  He continues to drink alcohol, has decreased alcohol consumption.  He drinks 1-2 times per week, usually a few beers, no bleeding events.   Review of Systems  Constitutional: Negative for appetite change, chills, fatigue, fever and unexpected weight change.  HENT:   Negative for hearing loss and voice change.   Eyes: Negative for eye problems and icterus.  Respiratory: Negative for chest tightness, cough and shortness of breath.   Cardiovascular: Negative for chest pain and leg swelling.  Gastrointestinal: Negative for abdominal distention and abdominal pain.  Endocrine: Negative for hot flashes.  Genitourinary: Negative for difficulty urinating, dysuria and frequency.   Musculoskeletal: Negative for arthralgias.  Skin: Negative for itching and rash.  Neurological: Negative for light-headedness and numbness.  Hematological: Negative for adenopathy. Does not bruise/bleed easily.  Psychiatric/Behavioral: Negative for confusion.    MEDICAL HISTORY:  Past Medical History:  Diagnosis Date  . Cancer of transverse colon ([place]) 12/2019  . Cirrhosis of liver ([place])   . Thrombocytopenia ([place])     SURGICAL HISTORY: Past Surgical History:  Procedure Laterality Date  . COLONOSCOPY WITH PROPOFOL N/A 01/02/2020   Procedure: COLONOSCOPY WITH PROPOFOL;  Surgeon: Toledo, Benay Pike, MD;  Location: [facility] ENDOSCOPY;  Service: Gastroenterology;  Laterality: N/A;  . ESOPHAGOGASTRODUODENOSCOPY (EGD) WITH PROPOFOL N/A 01/02/2020   Procedure: ESOPHAGOGASTRODUODENOSCOPY (EGD) WITH PROPOFOL;  Surgeon:  Alice Reichert, Benay Pike, MD;  Location: [facility]  ENDOSCOPY;  Service: Gastroenterology;  Laterality: N/A;  . laparascopic colectomy  01/29/2020    SOCIAL HISTORY: Social History   Socioeconomic History  . Marital status: Married    Spouse name: Not on file  . Number of children: Not on file  . Years of education: Not on file  . Highest education level: Not on file  Occupational History  . Not on file  Tobacco Use  . Smoking status: Current Some Day Smoker    Packs/day: 0.00    Types: Cigarettes  . Smokeless tobacco: Never Used  . Tobacco comment: occasional smoking  Vaping Use  . Vaping Use: Never used  Substance and Sexual Activity  . Alcohol use: Yes    Alcohol/week: 5.0 standard drinks    Types: 5 Cans of beer per week    Comment: 5 cans per week  . Drug use: No  . Sexual activity: Not on file  Other Topics Concern  . Not on file  Social History Narrative  . Not on file   Social Determinants of Health   Financial Resource Strain: Not on file  Food Insecurity: Not on file  Transportation Needs: Not on file  Physical Activity: Not on file  Stress: Not on file  Social Connections: Not on file  Intimate Partner Violence: Not on file    FAMILY HISTORY: Family History  Problem Relation Age of Onset  . Cirrhosis Father     ALLERGIES:  has No Known Allergies.  MEDICATIONS:  Current Outpatient Medications  Medication Sig Dispense Refill  . HYDROcodone-acetaminophen (NORCO/VICODIN) 5-325 MG tablet as needed. (Patient not taking: Reported on 09/10/2020)    . lactulose (CHRONULAC) 10 GM/15ML solution Take 10 g by mouth 2 (two) times daily. (Patient not taking: Reported on 09/10/2020)    . Misc Natural Products (HERBAL ENERGY COMPLEX PO) Take 3 tablets by mouth daily. (Patient not taking: No sig reported)     No current facility-administered medications for this visit.     PHYSICAL EXAMINATION: ECOG PERFORMANCE STATUS: 0 - Asymptomatic Vitals:   09/10/20 1411  BP: (!)  147/84  Pulse: 83  Resp: 18  Temp: 98.8 F (37.1 C)   Filed Weights   09/10/20 1411  Weight: 170 lb 1.6 oz (77.2 kg)    Physical Exam Constitutional:      General: He is not in acute distress. HENT:     Head: Normocephalic and atraumatic.  Eyes:     General: No scleral icterus. Cardiovascular:     Rate and Rhythm: Normal rate and regular rhythm.     Heart sounds: Normal heart sounds.  Pulmonary:     Effort: Pulmonary effort is normal. No respiratory distress.     Breath sounds: No wheezing.  Abdominal:     General: Bowel sounds are normal. There is no distension.     Palpations: Abdomen is soft.  Musculoskeletal:        General: No deformity. Normal range of motion.     Cervical back: Normal range of motion and neck supple.  Skin:    General: Skin is warm and dry.     Findings: No erythema or rash.  Neurological:     Mental Status: He is alert and oriented to person, place, and time. Mental status is at baseline.     Cranial Nerves: No cranial nerve deficit.     Coordination: Coordination normal.  Psychiatric:        Mood and Affect: Mood normal.     LABORATORY DATA:  I have reviewed the data as listed Lab Results  Component Value Date   WBC 7.3 09/10/2020   HGB 15.4 09/10/2020   HCT 43.2 09/10/2020   MCV 89.8 09/10/2020   PLT 109 (L) 09/10/2020   Recent Labs    01/29/20 1639 01/30/20 0811 09/10/20 1349  NA  --  134* 134*  K  --  3.9 3.5  CL  --  103 102  CO2  --  23 25  GLUCOSE  --  109* 101*  BUN  --  12 12  CREATININE 1.07 0.69 0.64  CALCIUM  --  7.9* 8.7*  GFRNONAA >60 >60 >60  GFRAA >60 >60  --   PROT  --   --  8.8*  ALBUMIN  --   --  3.6  AST  --   --  48*  ALT  --   --  25  ALKPHOS  --   --  109  BILITOT  --   --  1.7*   Iron/TIBC/Ferritin/ %Sat No results found for: IRON, TIBC, FERRITIN, IRONPCTSAT    RADIOGRAPHIC STUDIES: I have personally reviewed the radiological images as listed and agreed with the findings in the report. No  results found.    ASSESSMENT & PLAN:  1. Malignant neoplasm of transverse colon ([place])   2. Thrombocytopenia ([place])   3. Alcoholic cirrhosis of liver without ascites (HCC)   Cancer Staging Colon cancer [facility]) Staging form: Colon and Rectum, AJCC 8th Edition - Clinical: Stage Unknown (cTX, cN0, cM0) - Signed by Earlie Server, MD on 02/25/2020 - Pathologic: Stage I (pT1, pN0, cM0) - Signed by Earlie Server, MD on 02/25/2020   Stage I transverse colon invasive carcinoma, pT1 pN0 cM0 He has a preop CEA of 9.1. Labs are reviewed and discussed with patient. CEA is pending. Recommend patient to get colonoscopy 1 year after his surgery- in June 2022. He needs to contact GI for an appointmen    Thrombocytopenia, chronic, likely secondary to liver cirrhosis.  Normal folate and B12 levels.  Stable, observation.   Chronic alcohol use, in the context of cirrhosis, discussed about alcohol cessation.  Continue follow-up with gastroenterology for labs and [facility] surveillance.  Spanish interpreter was present for the entire encounter for interpreting Orders Placed This Encounter  Procedures  . CBC with Differential/Platelet    Standing Status:   Future    Standing Expiration Date:   09/10/2021  . Comprehensive metabolic panel    Standing Status:   Future    Standing Expiration Date:   09/10/2021  . CEA    Standing Status:   Future    Standing Expiration Date:   09/10/2021    All questions were answered. The patient knows to call the clinic with any problems questions or concerns.  cc No ref. provider found    Return of visit: 6 months Thank you for this kind referral and the opportunity to participate in the care of this patient. A copy of today's note is routed to referring provider    Earlie Server, MD, PhD Hematology Oncology [facility] Pager- [phone] 09/10/2020

## 2020-09-11 LAB — CEA: CEA: 8.2 ng/mL — ABNORMAL HIGH (ref 0.0–4.7)

## 2020-09-17 ENCOUNTER — Telehealth: Payer: Self-pay

## 2020-09-17 DIAGNOSIS — C184 Malignant neoplasm of transverse colon: Secondary | ICD-10-CM

## 2020-09-17 DIAGNOSIS — R970 Elevated carcinoembryonic antigen [CEA]: Secondary | ICD-10-CM

## 2020-09-17 NOTE — Telephone Encounter (Signed)
Patient notified of appt and advised to pick up contrast prior to CT and NPO 4  hours prior to scan. Appt reminder will be mailed out per pt request.

## 2020-09-17 NOTE — Telephone Encounter (Signed)
CT scheduled for 1pm on 2/11.  Centralized scheduling is setting up interpreter services. Thanks, [employer]

## 2020-09-17 NOTE — Telephone Encounter (Signed)
Please schedule CT ch/a/p- next available. I will call pt to let him know about appointment. Thanks

## 2020-09-17 NOTE — Telephone Encounter (Signed)
-----   Message from Earlie Server, MD sent at 09/16/2020 11:12 PM EST ----- Please let pt know that his cancer marker is persistently elevated, recommend pt to have CT chest abdomen pelvis w contrast. (Oral and IV). Thanks.

## 2020-09-26 ENCOUNTER — Ambulatory Visit
Admission: RE | Admit: 2020-09-26 | Discharge: 2020-09-26 | Disposition: A | Discharge status: Home / Self Care | Payer: Self-pay | Source: Ambulatory Visit | Attending: Oncology | Admitting: Oncology

## 2020-09-26 ENCOUNTER — Other Ambulatory Visit: Payer: Self-pay

## 2020-09-26 DIAGNOSIS — C184 Malignant neoplasm of transverse colon: Secondary | ICD-10-CM | POA: Insufficient documentation

## 2020-09-26 DIAGNOSIS — R970 Elevated carcinoembryonic antigen [CEA]: Secondary | ICD-10-CM | POA: Insufficient documentation

## 2020-09-26 MED ORDER — IOHEXOL 350 MG/ML SOLN: 100.0000 mL | Freq: Once | INTRAVENOUS | Status: DC | PRN

## 2020-09-26 MED ORDER — IOHEXOL 300 MG/ML  SOLN
100.0000 mL | Freq: Once | INTRAMUSCULAR | Status: AC | PRN
  Administered 2020-09-26: 100 mL via INTRAVENOUS

## 2020-09-30 ENCOUNTER — Telehealth: Payer: Self-pay

## 2020-09-30 NOTE — Telephone Encounter (Signed)
Pt.notified

## 2020-09-30 NOTE — Telephone Encounter (Signed)
-----   Message from Earlie Server, MD sent at 09/29/2020 11:04 PM EST ----- Let him know that his CT scan is normal. Recommend him to keep current follow up appt. Thanks.

## 2021-01-23 ENCOUNTER — Other Ambulatory Visit: Payer: Self-pay | Admitting: Physician Assistant

## 2021-01-23 ENCOUNTER — Other Ambulatory Visit: Payer: Self-pay

## 2021-01-23 ENCOUNTER — Ambulatory Visit
Admission: RE | Admit: 2021-01-23 | Discharge: 2021-01-23 | Disposition: A | Discharge status: Home / Self Care | Payer: BLUE CROSS/BLUE SHIELD | Source: Ambulatory Visit | Attending: Physician Assistant | Admitting: Physician Assistant

## 2021-01-23 DIAGNOSIS — M7989 Other specified soft tissue disorders: Secondary | ICD-10-CM

## 2021-03-09 ENCOUNTER — Other Ambulatory Visit: Payer: Self-pay

## 2021-03-09 ENCOUNTER — Inpatient Hospital Stay: Payer: BLUE CROSS/BLUE SHIELD | Attending: Oncology

## 2021-03-09 DIAGNOSIS — D696 Thrombocytopenia, unspecified: Secondary | ICD-10-CM | POA: Diagnosis not present

## 2021-03-09 DIAGNOSIS — C184 Malignant neoplasm of transverse colon: Secondary | ICD-10-CM | POA: Insufficient documentation

## 2021-03-09 DIAGNOSIS — F17210 Nicotine dependence, cigarettes, uncomplicated: Secondary | ICD-10-CM | POA: Diagnosis not present

## 2021-03-09 DIAGNOSIS — Z79899 Other long term (current) drug therapy: Secondary | ICD-10-CM | POA: Diagnosis not present

## 2021-03-09 LAB — COMPREHENSIVE METABOLIC PANEL
ALT: 33 U/L (ref 0–44)
AST: 52 U/L — ABNORMAL HIGH (ref 15–41)
Albumin: 3.3 g/dL — ABNORMAL LOW (ref 3.5–5.0)
Alkaline Phosphatase: 104 U/L (ref 38–126)
Anion gap: 8 (ref 5–15)
BUN: 9 mg/dL (ref 6–20)
CO2: 23 mmol/L (ref 22–32)
Calcium: 8.2 mg/dL — ABNORMAL LOW (ref 8.9–10.3)
Chloride: 107 mmol/L (ref 98–111)
Creatinine, Ser: 0.82 mg/dL (ref 0.61–1.24)
GFR, Estimated: >60 mL/min (ref >60)
Glucose, Bld: 127 mg/dL — ABNORMAL HIGH (ref 70–99)
Potassium: 3.2 mmol/L — ABNORMAL LOW (ref 3.5–5.1)
Sodium: 138 mmol/L (ref 135–145)
Total Bilirubin: 0.9 mg/dL (ref 0.3–1.2)
Total Protein: 7.9 g/dL (ref 6.5–8.1)

## 2021-03-09 LAB — CBC WITH DIFFERENTIAL/PLATELET
Abs Immature Granulocytes: 0.02 10*3/uL (ref 0.00–0.07)
Basophils Absolute: 0.1 10*3/uL (ref 0.0–0.1)
Basophils Relative: 1 %
Eosinophils Absolute: 0.2 10*3/uL (ref 0.0–0.5)
Eosinophils Relative: 3 %
HCT: 40.1 % (ref 39.0–52.0)
Hemoglobin: 13.9 g/dL (ref 13.0–17.0)
Immature Granulocytes: 0 %
Lymphocytes Relative: 29 %
Lymphs Abs: 1.9 10*3/uL (ref 0.7–4.0)
MCH: 32.3 pg (ref 26.0–34.0)
MCHC: 34.7 g/dL (ref 30.0–36.0)
MCV: 93.3 fL (ref 80.0–100.0)
Monocytes Absolute: 0.7 10*3/uL (ref 0.1–1.0)
Monocytes Relative: 11 %
Neutro Abs: 3.7 10*3/uL (ref 1.7–7.7)
Neutrophils Relative %: 56 %
Platelets: 121 10*3/uL — ABNORMAL LOW (ref 150–400)
RBC: 4.3 MIL/uL (ref 4.22–5.81)
RDW: 13.7 % (ref 11.5–15.5)
WBC: 6.5 10*3/uL (ref 4.0–10.5)
nRBC: 0 % (ref 0.0–0.2)

## 2021-03-10 ENCOUNTER — Inpatient Hospital Stay (HOSPITAL_BASED_OUTPATIENT_CLINIC_OR_DEPARTMENT_OTHER): Payer: BLUE CROSS/BLUE SHIELD | Admitting: Oncology

## 2021-03-10 ENCOUNTER — Encounter: Payer: Self-pay | Admitting: Oncology

## 2021-03-10 VITALS — BP 135/79 | HR 77 | Temp 97.3°F | Resp 18 | Wt 173.8 lb

## 2021-03-10 DIAGNOSIS — C184 Malignant neoplasm of transverse colon: Secondary | ICD-10-CM | POA: Diagnosis not present

## 2021-03-10 DIAGNOSIS — M7989 Other specified soft tissue disorders: Secondary | ICD-10-CM | POA: Diagnosis not present

## 2021-03-10 DIAGNOSIS — R970 Elevated carcinoembryonic antigen [CEA]: Secondary | ICD-10-CM | POA: Diagnosis not present

## 2021-03-10 DIAGNOSIS — D696 Thrombocytopenia, unspecified: Secondary | ICD-10-CM | POA: Diagnosis not present

## 2021-03-10 DIAGNOSIS — K7030 Alcoholic cirrhosis of liver without ascites: Secondary | ICD-10-CM

## 2021-03-10 LAB — CEA: CEA: 7.5 ng/mL — ABNORMAL HIGH (ref 0.0–4.7)

## 2021-03-10 NOTE — Progress Notes (Signed)
Hematology/Oncology follow up note [facility] [phone] [phone]   Patient Care Team: Darl Pikes as PCP - General (Physician Assistant) Clent Jacks, RN as Oncology Nurse Navigator  REFERRING PROVIDER: No ref. provider found  CHIEF COMPLAINTS/REASON FOR VISIT:  Follow up for colon cancer  HISTORY OF PRESENTING ILLNESS:   Timothy Goodman is a  60 y.o.  male with PMH listed below was seen in consultation at the request of  No ref. provider found  for evaluation of colon cancer 01/02/2020 screening colonoscopy showed 5 mm polyp in the proximal transverse colon, removed and retrieved.  Mucosal ulceration biopsied and tattooed. Biopsy pathology showed cecum polyp is tubular adenoma.  Negative for high-grade dysplasia and malignancy. 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 Transverse colon polyp showed hyperplastic polyp.  Deeper sections examined.  Negative for dysplasia and malignancy. 01/08/2020, abdomen CT chest abdomen pelvis with contrast showed no distant metastasis. Patient was seen and evaluated by Dr. Peyton Najjar and was recommended for surgical resection. Preop CEA was 9.1, lab was done at [facility] clinic.  01/29/2020, patient underwent right hemicolectomy. 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  pT1 pN0,   Grade 1, well-differentiated, all margins were uninvolved by invasive carcinoma, high-grade dysplasia/intramucosal adenocarcinoma and low-grade dysplastic.  Patient was referred to establish care with oncology for evaluation. Patient reports feeling well.  Occasionally complains of soreness when he changes position from sitting to standing.  Otherwise doing well.  Denies any black or bloody stool. He denies any family history of cancer.  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  He admits to continue drink alcohol intermittently/on occasions.  Denies any bleeding events.   INTERVAL HISTORY Timothy Goodman is a 60 y.o. male who has above history reviewed by me today presents for follow up visit for management of stage I colon cancer.  Problems and complaints are listed below: He has no new complaints.  Due for colonosscopy.  He has no new complaints. Denies any bowel habit change, bloody or black stool.  He continues  to drink alcohol sometimes. .    Review of Systems  Constitutional:  Negative for appetite change, chills, fatigue, fever and unexpected weight change.  HENT:   Negative for hearing loss and voice change.   Eyes:  Negative for eye problems and icterus.  Respiratory:  Negative for chest tightness, cough and shortness of breath.   Cardiovascular:  Negative for chest pain and leg swelling.  Gastrointestinal:  Negative for abdominal distention and abdominal pain.  Endocrine: Negative for hot flashes.  Genitourinary:  Negative for difficulty urinating, dysuria and frequency.   Musculoskeletal:  Negative for arthralgias.  Skin:  Negative for itching and rash.  Neurological:  Negative for light-headedness and numbness.  Hematological:  Negative for adenopathy. Does not bruise/bleed easily.  Psychiatric/Behavioral:  Negative for confusion.    MEDICAL HISTORY:  Past Medical History:  Diagnosis Date   Cancer of transverse colon ([place]) 12/2019   Cirrhosis of liver ([place])    Thrombocytopenia ([place])     SURGICAL HISTORY: Past Surgical History:  Procedure Laterality Date   COLONOSCOPY WITH PROPOFOL N/A 01/02/2020   Procedure: COLONOSCOPY WITH PROPOFOL;  Surgeon: Toledo, Benay Pike, MD;  Location: [facility] ENDOSCOPY;  Service: Gastroenterology;  Laterality: N/A;   ESOPHAGOGASTRODUODENOSCOPY (EGD) WITH PROPOFOL N/A 01/02/2020   Procedure: ESOPHAGOGASTRODUODENOSCOPY (EGD) WITH PROPOFOL;  Surgeon: [place]one[place], [place]  K, MD;  Location: [facility]  ENDOSCOPY;  Service: Gastroenterology;  Laterality: N/A;   laparascopic colectomy  01/29/2020    SOCIAL HISTORY: Social History   Socioeconomic History   Marital status: Married    Spouse name: Not on file   Number of children: Not on file   Years of education: Not on file   Highest education level: Not on file  Occupational History   Not on file  Tobacco Use   Smoking status: Some Days    Packs/day: 0.00    Types: Cigarettes   Smokeless tobacco: Never   Tobacco comments:    occasional smoking  Vaping Use   Vaping Use: Never used  Substance and Sexual Activity   Alcohol use: Yes    Alcohol/week: 5.0 standard drinks    Types: 5 Cans of beer per week    Comment: 5 cans per week   Drug use: No   Sexual activity: Not on file  Other Topics Concern   Not on file  Social History Narrative   Not on file   Social Determinants of Health   Financial Resource Strain: Not on file  Food Insecurity: Not on file  Transportation Needs: Not on file  Physical Activity: Not on file  Stress: Not on file  Social Connections: Not on file  Intimate Partner Violence: Not on file    FAMILY HISTORY: Family History  Problem Relation Age of Onset   Cirrhosis Father     ALLERGIES:  has No Known Allergies.  MEDICATIONS:  Current Outpatient Medications  Medication Sig Dispense Refill   Multiple Vitamin (MULTIVITAMIN) tablet Take 1 tablet by mouth daily. [employer] multivitamin     HYDROcodone-acetaminophen (NORCO/VICODIN) 5-325 MG tablet as needed. (Patient not taking: No sig reported)     lactulose (CHRONULAC) 10 GM/15ML solution Take 10 g by mouth 2 (two) times daily. (Patient not taking: No sig reported)     Misc Natural Products (HERBAL ENERGY COMPLEX PO) Take 3 tablets by mouth daily. (Patient not taking: No sig reported)     No current facility-administered medications for this visit.     PHYSICAL EXAMINATION: ECOG PERFORMANCE STATUS: 0 - Asymptomatic Vitals:   03/10/21 1315   BP: 135/79  Pulse: 77  Resp: 18  Temp: (!) 97.3 F (36.3 C)   Filed Weights   03/10/21 1315  Weight: 173 lb 12.8 oz (78.8 kg)    Physical Exam Constitutional:      General: He is not in acute distress. HENT:     Head: Normocephalic and atraumatic.  Eyes:     General: No scleral icterus. Cardiovascular:     Rate and Rhythm: Normal rate and regular rhythm.     Heart sounds: Normal heart sounds.  Pulmonary:     Effort: Pulmonary effort is normal. No respiratory distress.     Breath sounds: No wheezing.  Abdominal:     General: Bowel sounds are normal. There is no distension.     Palpations: Abdomen is soft.  Musculoskeletal:        General: No deformity. Normal range of motion.     Cervical back: Normal range of motion and neck supple.  Skin:    General: Skin is warm and dry.     Findings: No erythema or rash.  Neurological:     Mental Status: He is alert and oriented to person, place, and time. Mental status is at baseline.     Cranial Nerves: No cranial nerve deficit.     Coordination:  Coordination normal.  Psychiatric:        Mood and Affect: Mood normal.    LABORATORY DATA:  I have reviewed the data as listed Lab Results  Component Value Date   WBC 6.5 03/09/2021   HGB 13.9 03/09/2021   HCT 40.1 03/09/2021   MCV 93.3 03/09/2021   PLT 121 (L) 03/09/2021   Recent Labs    09/10/20 1349 03/09/21 1326  NA 134* 138  K 3.5 3.2*  CL 102 107  CO2 25 23  GLUCOSE 101* 127*  BUN 12 9  CREATININE 0.64 0.82  CALCIUM 8.7* 8.2*  GFRNONAA >60 >60  PROT 8.8* 7.9  ALBUMIN 3.6 3.3*  AST 48* 52*  ALT 25 33  ALKPHOS 109 104  BILITOT 1.7* 0.9    Iron/TIBC/Ferritin/ %Sat No results found for: IRON, TIBC, FERRITIN, IRONPCTSAT    RADIOGRAPHIC STUDIES: I have personally reviewed the radiological images as listed and agreed with the findings in the report. US Venous Img Lower Unilateral Right (DVT)  Result Date: 01/23/2021 CLINICAL DATA:  60 year old male with  leg swelling EXAM: RIGHT LOWER EXTREMITY VENOUS DOPPLER ULTRASOUND TECHNIQUE: Gray-scale sonography with graded compression, as well as color Doppler and duplex ultrasound were performed to evaluate the lower extremity deep venous systems from the level of the common femoral vein and including the common femoral, femoral, profunda femoral, popliteal and calf veins including the posterior tibial, peroneal and gastrocnemius veins when visible. The superficial great saphenous vein was also interrogated. Spectral Doppler was utilized to evaluate flow at rest and with distal augmentation maneuvers in the common femoral, femoral and popliteal veins. COMPARISON:  None. FINDINGS: Contralateral Common Femoral Vein: Respiratory phasicity is normal and symmetric with the symptomatic side. No evidence of thrombus. Normal compressibility. Common Femoral Vein: No evidence of thrombus. Normal compressibility, respiratory phasicity and response to augmentation. Saphenofemoral Junction: No evidence of thrombus. Normal compressibility and flow on color Doppler imaging. Profunda Femoral Vein: No evidence of thrombus. Normal compressibility and flow on color Doppler imaging. Femoral Vein: No evidence of thrombus. Normal compressibility, respiratory phasicity and response to augmentation. Popliteal Vein: No evidence of thrombus. Normal compressibility, respiratory phasicity and response to augmentation. Calf Veins: No evidence of thrombus. Normal compressibility and flow on color Doppler imaging. Superficial Great Saphenous Vein: No evidence of thrombus. Normal compressibility and flow on color Doppler imaging. Other Findings:  None. IMPRESSION: No evidence of deep venous thrombosis. Electronically Signed   By: Corrie Mckusick D.O.   On: 01/23/2021 10:46      ASSESSMENT & PLAN:  1. Malignant neoplasm of transverse colon ([place])   2. Left leg swelling   3. Elevated CEA   4. Thrombocytopenia ([place])   5. Alcoholic cirrhosis of liver  without ascites (HCC)   Cancer Staging Colon cancer [facility]) Staging form: Colon and Rectum, AJCC 8th Edition - Clinical: Stage Unknown (cTX, cN0, cM0) - Signed by Earlie Server, MD on 02/25/2020 - Pathologic: Stage I (pT1, pN0, cM0) - Signed by Earlie Server, MD on 02/25/2020   Stage I transverse colon invasive carcinoma, pT1 pN0 cM0 He has a preop CEA of 9.1. 09/10/2020 CEA remains elevated at 8.2,  CT scan showed no signs of cancer.  03/09/2021 CEA 7.5  chronically elevated CEA is likely due to cirrhosis.  Labs are reviewed and discussed with patient. CEA is pending. Recommend patient to get colonoscopy 1 year after his surgery- in June 2022. He needs to contact GI for an appointmen   Thrombocytopenia, chronic, likely secondary  to liver cirrhosis.  Normal folate and B12 levels.  Stable, observation.   Chronic alcohol use, in the context of cirrhosis, recommend complete alcohol cessation.  Recommend him to follow up with gastroenterology for labs and [facility] surveillance. I discussed with GI team  Octavia Bruckner who will arrange patient to get colonscopy [1 year milestone].   Bilateral ankle swelling, 01/23/21 right lower extremity US neg for DVT. I will check left [place] to rule out DVT.  Probably due to vein insufficiency/   Online Spanish interpreter was present for the entire encounter for interpreting Orders Placed This Encounter  Procedures   US Venous Img Lower Unilateral Left    Standing Status:   Future    Standing Expiration Date:   03/10/2022    Order Specific Question:   Reason for Exam (SYMPTOM  OR DIAGNOSIS REQUIRED)    Answer:   left lower leg swelling    Order Specific Question:   Preferred imaging location?    Answer:   [facility]   CBC with Differential/Platelet    Standing Status:   Future    Standing Expiration Date:   03/10/2022   Comprehensive metabolic panel    Standing Status:   Future    Standing Expiration Date:   03/10/2022   CEA    Standing Status:   Future     Standing Expiration Date:   03/10/2022    All questions were answered. The patient knows to call the clinic with any problems questions or concerns.  cc No ref. provider found    Return of visit: 12 months    Earlie Server, MD, PhD Hematology Oncology [facility] Pager- [phone] 03/10/2021

## 2021-03-10 NOTE — Progress Notes (Signed)
Patient here for follow up. Pt reports swelling to left ankle that started approx 5-6 months.

## 2021-03-17 ENCOUNTER — Other Ambulatory Visit: Payer: Self-pay

## 2021-03-17 ENCOUNTER — Ambulatory Visit: Payer: BLUE CROSS/BLUE SHIELD

## 2021-07-18 IMAGING — US US EXTREM LOW VENOUS*R*
1 series · 13 of 24 positions shown · non-contrast
Comparison: None.

CLINICAL DATA: 59-year-old male with leg swelling



[Series 1: us venous img lower uni right (dvt) · portal-venous · 13 of 33 slices shown]
[im 1/33]
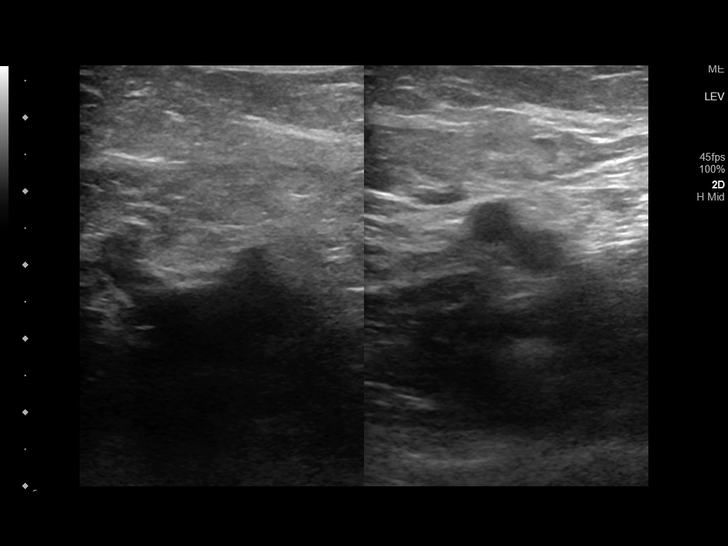
[im 3/33]
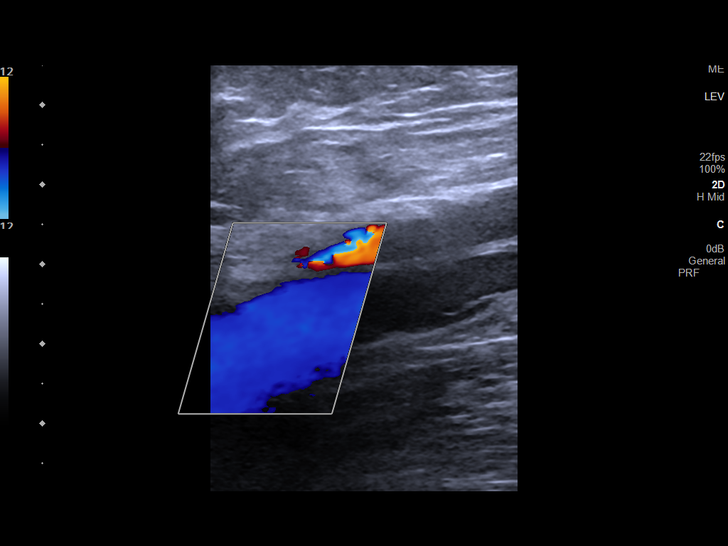
[im 6/33]
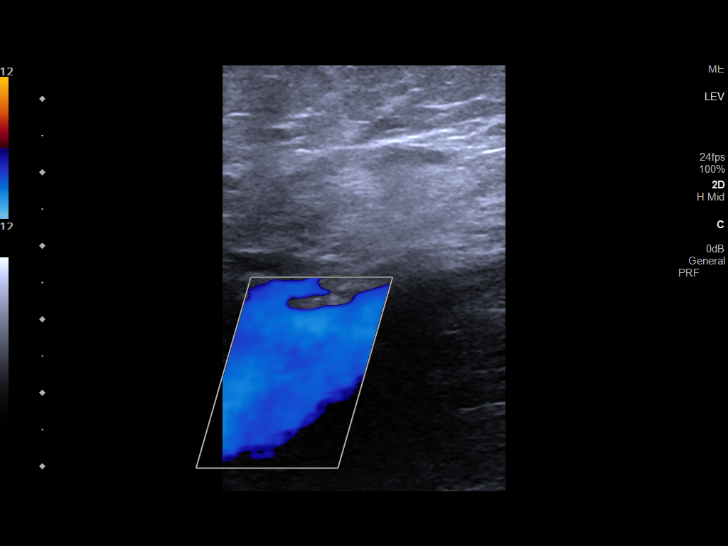
[im 9/33]
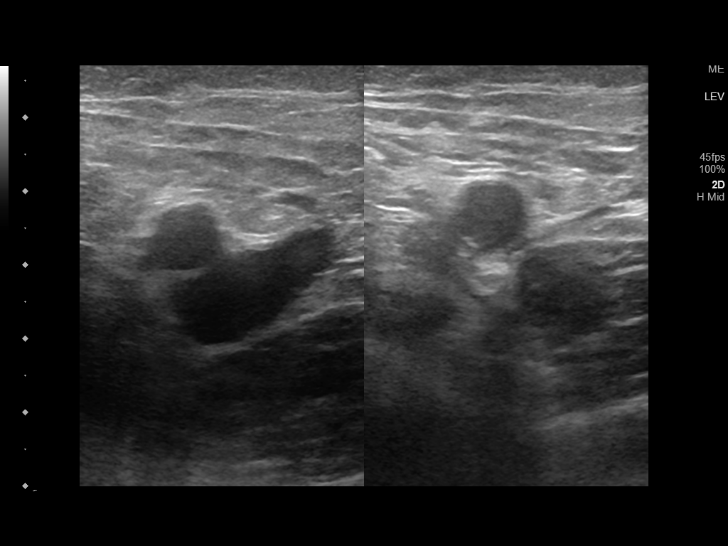
[im 12/33]
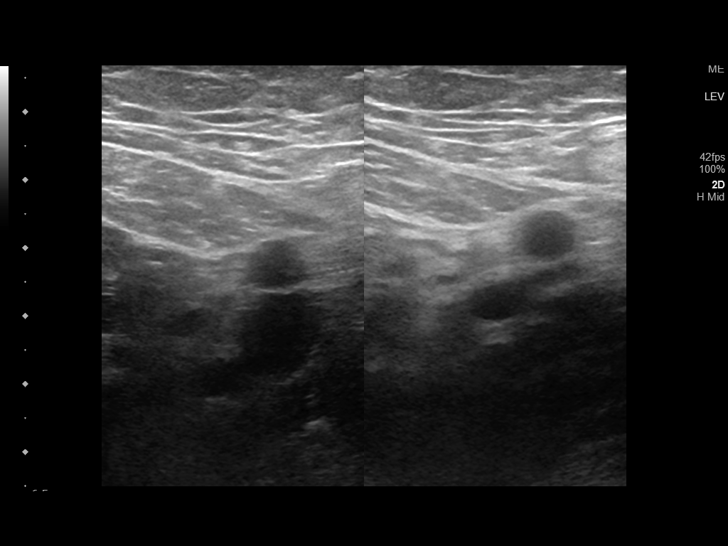
[im 14/33]
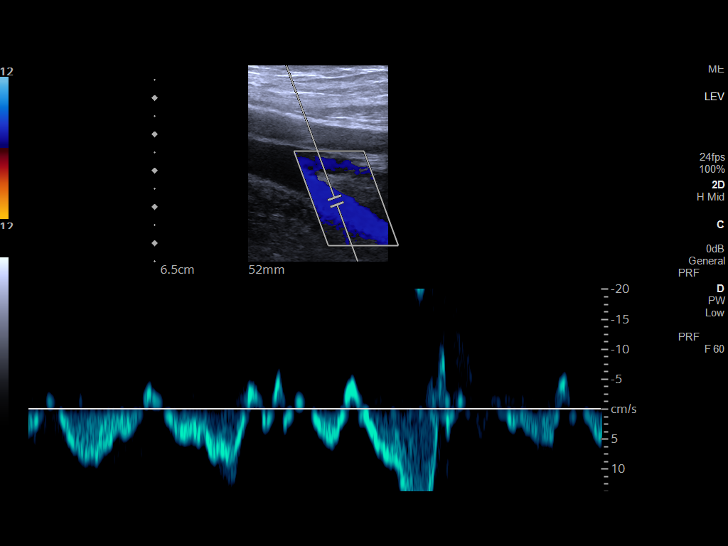
[im 17/33]
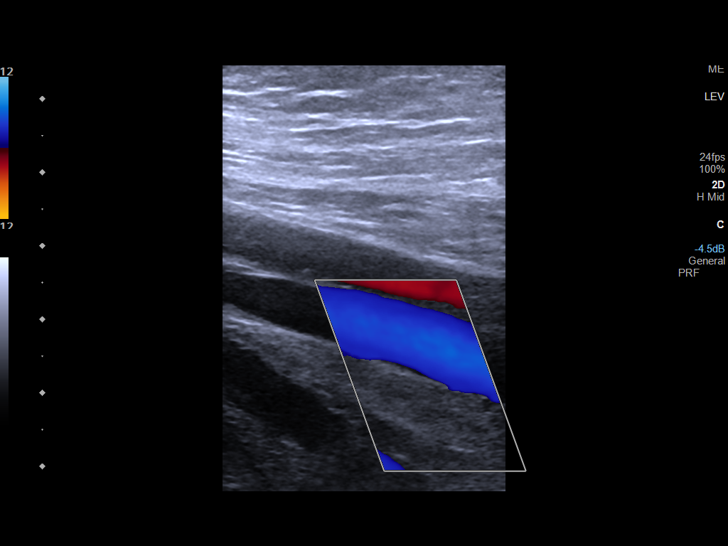
[im 19/33]
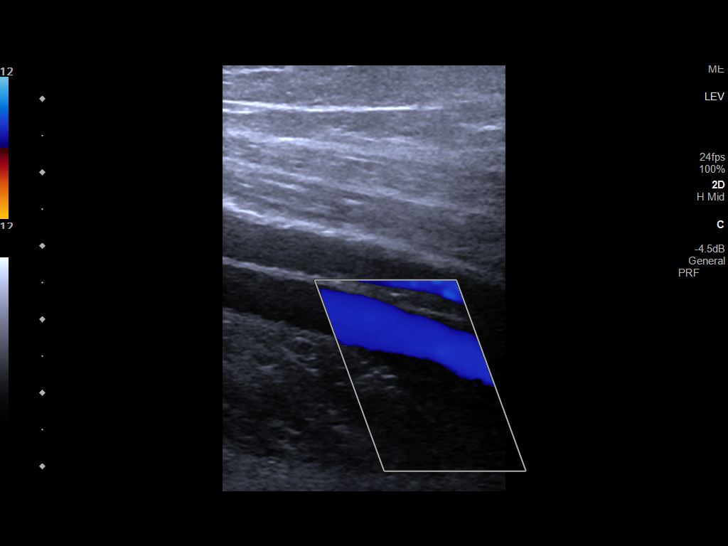
[im 21/33]
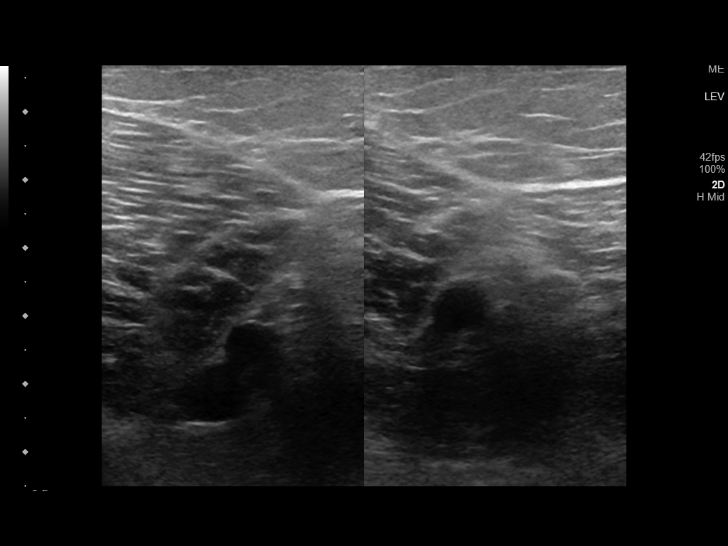
[im 24/33]
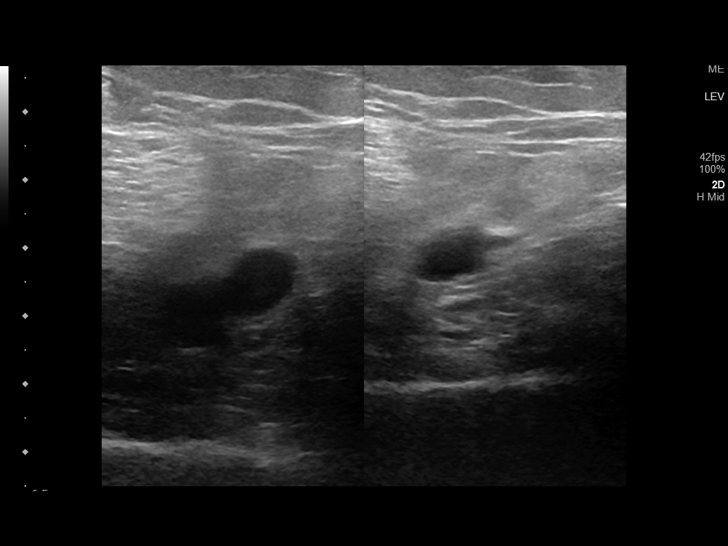
[im 27/33]
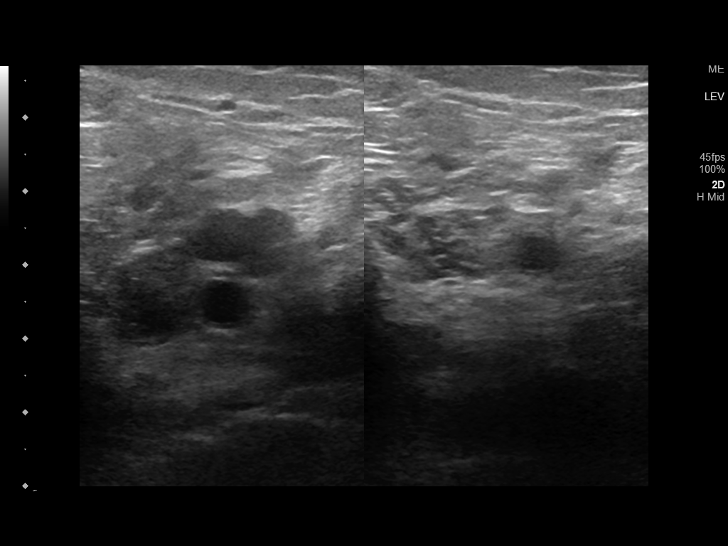
[im 30/33]
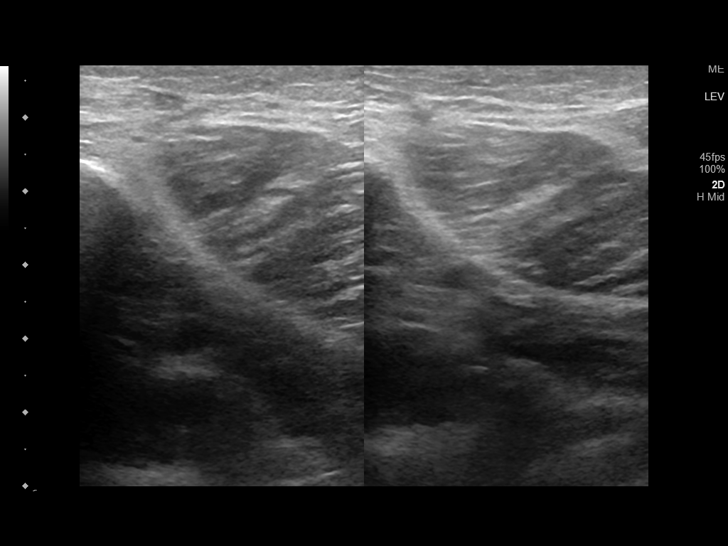
[im 33/33]
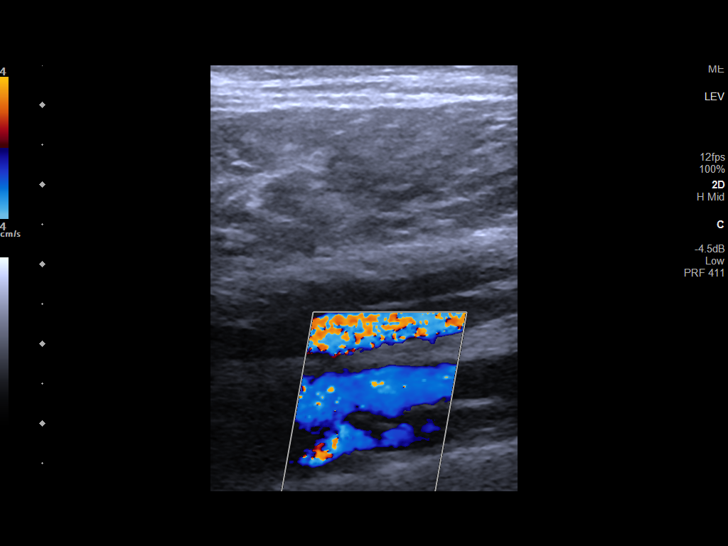

[13 of 24 positions shown; findings below may reference images not displayed]

FINDINGS: Contralateral Common Femoral Vein: Respiratory phasicity is normal
and symmetric with the symptomatic side. No evidence of thrombus.
Normal compressibility.

Common Femoral Vein: No evidence of thrombus. Normal
compressibility, respiratory phasicity and response to augmentation.

Saphenofemoral Junction: No evidence of thrombus. Normal
compressibility and flow on color Doppler imaging.

Profunda Femoral Vein: No evidence of thrombus. Normal
compressibility and flow on color Doppler imaging.

Femoral Vein: No evidence of thrombus. Normal compressibility,
respiratory phasicity and response to augmentation.

Popliteal Vein: No evidence of thrombus. Normal compressibility,
respiratory phasicity and response to augmentation.

Calf Veins: No evidence of thrombus. Normal compressibility and flow
on color Doppler imaging.

Superficial Great Saphenous Vein: No evidence of thrombus. Normal
compressibility and flow on color Doppler imaging.

Other Findings:  None.
IMPRESSION: No evidence of deep venous thrombosis.

## 2022-03-10 ENCOUNTER — Inpatient Hospital Stay: Payer: Self-pay | Attending: Oncology

## 2022-03-10 DIAGNOSIS — R970 Elevated carcinoembryonic antigen [CEA]: Secondary | ICD-10-CM | POA: Insufficient documentation

## 2022-03-10 DIAGNOSIS — K7030 Alcoholic cirrhosis of liver without ascites: Secondary | ICD-10-CM | POA: Insufficient documentation

## 2022-03-10 DIAGNOSIS — F17210 Nicotine dependence, cigarettes, uncomplicated: Secondary | ICD-10-CM | POA: Insufficient documentation

## 2022-03-10 DIAGNOSIS — D696 Thrombocytopenia, unspecified: Secondary | ICD-10-CM | POA: Insufficient documentation

## 2022-03-10 DIAGNOSIS — Z79899 Other long term (current) drug therapy: Secondary | ICD-10-CM | POA: Insufficient documentation

## 2022-03-10 DIAGNOSIS — Z85038 Personal history of other malignant neoplasm of large intestine: Secondary | ICD-10-CM | POA: Insufficient documentation

## 2022-03-12 ENCOUNTER — Inpatient Hospital Stay: Payer: Self-pay

## 2022-03-12 ENCOUNTER — Encounter: Payer: Self-pay | Admitting: Oncology

## 2022-03-12 ENCOUNTER — Other Ambulatory Visit: Payer: Self-pay

## 2022-03-12 ENCOUNTER — Inpatient Hospital Stay (HOSPITAL_BASED_OUTPATIENT_CLINIC_OR_DEPARTMENT_OTHER): Payer: Self-pay | Admitting: Oncology

## 2022-03-12 VITALS — BP 124/96 | HR 91 | Temp 97.7°F | Wt 175.0 lb

## 2022-03-12 DIAGNOSIS — D696 Thrombocytopenia, unspecified: Secondary | ICD-10-CM

## 2022-03-12 DIAGNOSIS — C184 Malignant neoplasm of transverse colon: Secondary | ICD-10-CM

## 2022-03-12 DIAGNOSIS — R970 Elevated carcinoembryonic antigen [CEA]: Secondary | ICD-10-CM

## 2022-03-12 DIAGNOSIS — K7030 Alcoholic cirrhosis of liver without ascites: Secondary | ICD-10-CM

## 2022-03-12 LAB — PROTIME-INR
INR: 1.3 — ABNORMAL HIGH (ref 0.8–1.2)
Prothrombin Time: 16.3 seconds — ABNORMAL HIGH (ref 11.4–15.2)

## 2022-03-13 LAB — AFP TUMOR MARKER: AFP, Serum, Tumor Marker: 6.8 ng/mL (ref 0.0–8.4)

## 2022-03-13 LAB — CEA: CEA: 9.2 ng/mL — ABNORMAL HIGH (ref 0.0–4.7)

## 2022-03-13 NOTE — Progress Notes (Signed)
Hematology/Oncology Progress note [phone] [phone]      Patient Care Team: Darl Pikes as PCP - General (Physician Assistant) Clent Jacks, RN as Oncology Nurse Navigator  REFERRING PROVIDER: Mortimer Fries, PA-C  CHIEF COMPLAINTS/REASON FOR VISIT:  Follow up for colon cancer  HISTORY OF PRESENTING ILLNESS:   Timothy Goodman is a  61 y.o.  male with PMH listed below was seen in consultation at the request of  Mortimer Fries, PA-C  for evaluation of colon cancer 01/02/2020 screening colonoscopy showed 5 mm polyp in the proximal transverse colon, removed and retrieved.  Mucosal ulceration biopsied and tattooed. Biopsy pathology showed cecum polyp is tubular adenoma.  Negative for high-grade dysplasia and malignancy. 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 Transverse colon polyp showed hyperplastic polyp.  Deeper sections examined.  Negative for dysplasia and malignancy. 01/08/2020, abdomen CT chest abdomen pelvis with contrast showed no distant metastasis. Patient was seen and evaluated by Dr. Peyton Najjar and was recommended for surgical resection. Preop CEA was 9.1, lab was done at [facility] clinic.  01/29/2020, patient underwent right hemicolectomy. 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  pT1 pN0,   Grade 1, well-differentiated, all margins were uninvolved by invasive carcinoma, high-grade dysplasia/intramucosal adenocarcinoma and low-grade dysplastic.  Patient was referred to establish care with oncology for evaluation. Patient reports feeling well.  Occasionally complains of soreness when he changes position from sitting to standing.  Otherwise doing well.  Denies any black or bloody stool. He denies any family history of cancer.  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  He admits to  continue drink alcohol intermittently/on occasions.  Denies any bleeding events.   INTERVAL HISTORY Timothy Goodman is a 61 y.o. male who has above history reviewed by me today presents for follow up visit for management of stage I colon cancer.  Patient is overdue for colonoscopy.  His insurance did not cover him to get it done at [facility] clinic and he was referred to [facility]  Patient did not estalish with [facility] and is overdue.  He feels really well, Denies weight loss, fever, chills, fatigue, night sweats.  Denies any bowel habit change, bloody or black stool.  He has changed job and has new insurance now.    Review of Systems  Constitutional:  Negative for appetite change, chills, fatigue, fever and unexpected weight change.  HENT:   Negative for hearing loss and voice change.   Eyes:  Negative for eye problems and icterus.  Respiratory:  Negative for chest tightness, cough and shortness of breath.   Cardiovascular:  Negative for chest pain and leg swelling.  Gastrointestinal:  Negative for abdominal distention and abdominal pain.  Endocrine: Negative for hot flashes.  Genitourinary:  Negative for difficulty urinating, dysuria and frequency.   Musculoskeletal:  Negative for arthralgias.  Skin:  Negative for itching and rash.  Neurological:  Negative for light-headedness and numbness.  Hematological:  Negative for adenopathy. Does not bruise/bleed easily.  Psychiatric/Behavioral:  Negative for confusion.     MEDICAL HISTORY:  Past Medical History:  Diagnosis Date   Cancer of transverse colon ([place]) 12/2019   Cirrhosis of liver ([place])    Thrombocytopenia ([place])     SURGICAL HISTORY: Past Surgical History:  Procedure Laterality Date   COLONOSCOPY WITH PROPOFOL N/A 01/02/2020   Procedure: COLONOSCOPY WITH PROPOFOL;  Surgeon: Alice Reichert, Benay Pike, MD;  Location: [facility]  ENDOSCOPY;  Service: Gastroenterology;  Laterality: N/A;   ESOPHAGOGASTRODUODENOSCOPY (EGD) WITH PROPOFOL N/A 01/02/2020    Procedure: ESOPHAGOGASTRODUODENOSCOPY (EGD) WITH PROPOFOL;  Surgeon: Toledo, Benay Pike, MD;  Location: [facility] ENDOSCOPY;  Service: Gastroenterology;  Laterality: N/A;   laparascopic colectomy  01/29/2020    SOCIAL HISTORY: Social History   Socioeconomic History   Marital status: Married    Spouse name: Not on file   Number of children: Not on file   Years of education: Not on file   Highest education level: Not on file  Occupational History   Not on file  Tobacco Use   Smoking status: Some Days    Packs/day: 0.00    Types: Cigarettes   Smokeless tobacco: Never   Tobacco comments:    occasional smoking  Vaping Use   Vaping Use: Never used  Substance and Sexual Activity   Alcohol use: Yes    Alcohol/week: 5.0 standard drinks of alcohol    Types: 5 Cans of beer per week    Comment: 5 cans per week   Drug use: No   Sexual activity: Not on file  Other Topics Concern   Not on file  Social History Narrative   Not on file   Social Determinants of Health   Financial Resource Strain: Not on file  Food Insecurity: Not on file  Transportation Needs: Not on file  Physical Activity: Not on file  Stress: Not on file  Social Connections: Not on file  Intimate Partner Violence: Not on file    FAMILY HISTORY: Family History  Problem Relation Age of Onset   Cirrhosis Father     ALLERGIES:  has No Known Allergies.  MEDICATIONS:  Current Outpatient Medications  Medication Sig Dispense Refill   Multiple Vitamin (MULTIVITAMIN) tablet Take 1 tablet by mouth daily. [employer] multivitamin     HYDROcodone-acetaminophen (NORCO/VICODIN) 5-325 MG tablet as needed. (Patient not taking: Reported on 09/10/2020)     lactulose (CHRONULAC) 10 GM/15ML solution Take 10 g by mouth 2 (two) times daily. (Patient not taking: Reported on 09/10/2020)     Misc Natural Products (HERBAL ENERGY COMPLEX PO) Take 3 tablets by mouth daily. (Patient not taking: Reported on 02/25/2020)     No current  facility-administered medications for this visit.     PHYSICAL EXAMINATION: ECOG PERFORMANCE STATUS: 0 - Asymptomatic Vitals:   03/12/22 1204  BP: (!) 124/96  Pulse: 91  Temp: 97.7 F (36.5 C)   Filed Weights   03/12/22 1204  Weight: 175 lb (79.4 kg)    Physical Exam Constitutional:      General: He is not in acute distress. HENT:     Head: Normocephalic and atraumatic.  Eyes:     General: No scleral icterus. Cardiovascular:     Rate and Rhythm: Normal rate and regular rhythm.     Heart sounds: Normal heart sounds.  Pulmonary:     Effort: Pulmonary effort is normal. No respiratory distress.     Breath sounds: No wheezing.  Abdominal:     General: Bowel sounds are normal. There is no distension.     Palpations: Abdomen is soft.  Musculoskeletal:        General: No deformity. Normal range of motion.     Cervical back: Normal range of motion and neck supple.  Skin:    General: Skin is warm and dry.     Findings: No erythema or rash.  Neurological:     Mental Status: He is alert and oriented to person,  place, and time. Mental status is at baseline.     Cranial Nerves: No cranial nerve deficit.     Coordination: Coordination normal.  Psychiatric:        Mood and Affect: Mood normal.    LABORATORY DATA:  I have reviewed the data as listed Lab Results  Component Value Date   WBC 6.5 03/09/2021   HGB 13.9 03/09/2021   HCT 40.1 03/09/2021   MCV 93.3 03/09/2021   PLT 121 (L) 03/09/2021   No results for input(s): "NA", "K", "CL", "CO2", "GLUCOSE", "BUN", "CREATININE", "CALCIUM", "GFRNONAA", "GFRAA", "PROT", "ALBUMIN", "AST", "ALT", "ALKPHOS", "BILITOT", "BILIDIR", "IBILI" in the last 8760 hours.  Iron/TIBC/Ferritin/ %Sat No results found for: "IRON", "TIBC", "FERRITIN", "IRONPCTSAT"    RADIOGRAPHIC STUDIES: I have personally reviewed the radiological images as listed and agreed with the findings in the report. No results found.    ASSESSMENT & PLAN:  1.  Malignant neoplasm of transverse colon (HCC)   2. Elevated CEA   3. Thrombocytopenia ([place])   4. Alcoholic cirrhosis of liver without ascites (HCC)    Cancer Staging  Colon cancer [facility]) Staging form: Colon and Rectum, AJCC 8th Edition - Clinical: Stage Unknown (cTX, cN0, cM0) - Signed by Earlie Server, MD on 02/25/2020 - Pathologic: Stage I (pT1, pN0, cM0) - Signed by Earlie Server, MD on 02/25/2020   Stage I transverse colon invasive carcinoma, pT1 pN0 cM0 chronically elevated CEA is likely due to cirrhosis.  Check CEA He is overdue for colonoscopy. Now he has new insurance and he would like to see if he can have colonoscopy performed locally. 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   Stable, observation.   Chronic alcohol use and liver cirrhosis, recommend complete alcohol cessation.  Recommend him to follow up with gastroenterology for labs and [facility] surveillance. Check AFP INR   Patient declined Spanish interpreter service. Orders Placed This Encounter  Procedures   CBC with Differential/Platelet    Standing Status:   Future    Standing Expiration Date:   03/13/2023   Comprehensive metabolic panel    Standing Status:   Future    Standing Expiration Date:   03/13/2023   CEA    Standing Status:   Future    Standing Expiration Date:   03/13/2023   AFP tumor marker    Standing Status:   Future    Number of Occurrences:   1    Standing Expiration Date:   03/13/2023   Protime-INR    Standing Status:   Future    Number of Occurrences:   1    Standing Expiration Date:   03/13/2023    All questions were answered. The patient knows to call the clinic with any problems questions or concerns.  cc Mortimer Fries, PA-C    Return of visit: 12 months    Earlie Server, MD, PhD Hematology Oncology 03/13/2022

## 2022-03-24 ENCOUNTER — Other Ambulatory Visit: Payer: Self-pay | Admitting: Gastroenterology

## 2022-03-24 DIAGNOSIS — K3189 Other diseases of stomach and duodenum: Secondary | ICD-10-CM

## 2022-03-24 DIAGNOSIS — D696 Thrombocytopenia, unspecified: Secondary | ICD-10-CM

## 2022-03-24 DIAGNOSIS — K7030 Alcoholic cirrhosis of liver without ascites: Secondary | ICD-10-CM

## 2022-10-25 DIAGNOSIS — H1033 Unspecified acute conjunctivitis, bilateral: Secondary | ICD-10-CM | POA: Diagnosis not present

## 2023-01-28 DIAGNOSIS — M25472 Effusion, left ankle: Secondary | ICD-10-CM | POA: Diagnosis not present

## 2023-01-28 DIAGNOSIS — K7030 Alcoholic cirrhosis of liver without ascites: Secondary | ICD-10-CM | POA: Diagnosis not present

## 2023-01-28 DIAGNOSIS — C189 Malignant neoplasm of colon, unspecified: Secondary | ICD-10-CM | POA: Diagnosis not present

## 2023-02-01 ENCOUNTER — Other Ambulatory Visit: Payer: Self-pay

## 2023-02-01 DIAGNOSIS — C189 Malignant neoplasm of colon, unspecified: Secondary | ICD-10-CM

## 2023-02-01 DIAGNOSIS — M25472 Effusion, left ankle: Secondary | ICD-10-CM

## 2023-03-14 ENCOUNTER — Inpatient Hospital Stay: Payer: 59 | Attending: Oncology

## 2023-03-16 ENCOUNTER — Other Ambulatory Visit: Payer: Self-pay

## 2023-03-16 DIAGNOSIS — C184 Malignant neoplasm of transverse colon: Secondary | ICD-10-CM

## 2023-03-17 ENCOUNTER — Inpatient Hospital Stay: Payer: 59 | Admitting: Oncology

## 2023-03-17 ENCOUNTER — Other Ambulatory Visit: Payer: 59

## 2023-04-07 ENCOUNTER — Inpatient Hospital Stay: Payer: 59 | Attending: Oncology

## 2023-04-07 ENCOUNTER — Encounter: Payer: Self-pay | Admitting: Oncology

## 2023-04-07 ENCOUNTER — Inpatient Hospital Stay (HOSPITAL_BASED_OUTPATIENT_CLINIC_OR_DEPARTMENT_OTHER): Payer: 59 | Admitting: Oncology

## 2023-04-07 VITALS — BP 129/65 | HR 70 | Temp 97.5°F | Resp 18 | Wt 164.5 lb

## 2023-04-07 DIAGNOSIS — C184 Malignant neoplasm of transverse colon: Secondary | ICD-10-CM

## 2023-04-07 DIAGNOSIS — M85672 Other cyst of bone, left ankle and foot: Secondary | ICD-10-CM | POA: Diagnosis not present

## 2023-04-07 DIAGNOSIS — D696 Thrombocytopenia, unspecified: Secondary | ICD-10-CM

## 2023-04-07 DIAGNOSIS — F17210 Nicotine dependence, cigarettes, uncomplicated: Secondary | ICD-10-CM | POA: Diagnosis not present

## 2023-04-07 DIAGNOSIS — K7460 Unspecified cirrhosis of liver: Secondary | ICD-10-CM | POA: Insufficient documentation

## 2023-04-07 DIAGNOSIS — Z85038 Personal history of other malignant neoplasm of large intestine: Secondary | ICD-10-CM | POA: Insufficient documentation

## 2023-04-07 DIAGNOSIS — K7469 Other cirrhosis of liver: Secondary | ICD-10-CM

## 2023-04-07 LAB — CBC WITH DIFFERENTIAL (CANCER CENTER ONLY)
Abs Immature Granulocytes: 0.02 10*3/uL (ref 0.00–0.07)
Basophils Absolute: 0 10*3/uL (ref 0.0–0.1)
Basophils Relative: 0 %
Eosinophils Absolute: 0.2 10*3/uL (ref 0.0–0.5)
Eosinophils Relative: 2 %
HCT: 40.2 % (ref 39.0–52.0)
Hemoglobin: 13.9 g/dL (ref 13.0–17.0)
Immature Granulocytes: 0 %
Lymphocytes Relative: 24 %
Lymphs Abs: 1.8 10*3/uL (ref 0.7–4.0)
MCH: 32.6 pg (ref 26.0–34.0)
MCHC: 34.6 g/dL (ref 30.0–36.0)
MCV: 94.1 fL (ref 80.0–100.0)
Monocytes Absolute: 0.8 10*3/uL (ref 0.1–1.0)
Monocytes Relative: 11 %
Neutro Abs: 4.6 10*3/uL (ref 1.7–7.7)
Neutrophils Relative %: 63 %
Platelet Count: 95 10*3/uL — ABNORMAL LOW (ref 150–400)
RBC: 4.27 MIL/uL (ref 4.22–5.81)
RDW: 13.4 % (ref 11.5–15.5)
WBC Count: 7.4 10*3/uL (ref 4.0–10.5)
nRBC: 0 % (ref 0.0–0.2)

## 2023-04-07 LAB — CMP (CANCER CENTER ONLY)
ALT: 25 U/L (ref 0–44)
AST: 39 U/L (ref 15–41)
Albumin: 3.1 g/dL — ABNORMAL LOW (ref 3.5–5.0)
Alkaline Phosphatase: 178 U/L — ABNORMAL HIGH (ref 38–126)
Anion gap: 6 (ref 5–15)
BUN: 13 mg/dL (ref 8–23)
CO2: 24 mmol/L (ref 22–32)
Calcium: 8.9 mg/dL (ref 8.9–10.3)
Chloride: 106 mmol/L (ref 98–111)
Creatinine: 0.69 mg/dL (ref 0.61–1.24)
GFR, Estimated: >60 mL/min (ref >60)
Glucose, Bld: 91 mg/dL (ref 70–99)
Potassium: 3.7 mmol/L (ref 3.5–5.1)
Sodium: 136 mmol/L (ref 135–145)
Total Bilirubin: 1.4 mg/dL — ABNORMAL HIGH (ref 0.3–1.2)
Total Protein: 7.7 g/dL (ref 6.5–8.1)

## 2023-04-07 NOTE — Assessment & Plan Note (Addendum)
Stage I transverse colon invasive carcinoma, pT1 pN0 cM0 chronically elevated CEA is likely due to cirrhosis.  Check CEA He is overdue for colonoscopy.  I discussed with GI team and patient will need to follow up with GI

## 2023-04-07 NOTE — Assessment & Plan Note (Signed)
Refer to orthopedic surgeon for further evaluation

## 2023-04-07 NOTE — Assessment & Plan Note (Signed)
He is overdue for EGD/colonoscopy for surveillance of varices.  Overdue for [facility] [place] screening.  I urge patient to make a follow up appt with GI.  Discussed with GI team.

## 2023-04-07 NOTE — Progress Notes (Signed)
Pt here for follow up. Repots that he has a know on left foot. It keeps growing and he is concerned. Denies pain.

## 2023-04-07 NOTE — Progress Notes (Signed)
Hematology/Oncology Progress note [phone] [phone]      Patient Care Team: Rico Ala as PCP - General (Physician Assistant) Benita Gutter, RN as Oncology Nurse Navigator Rickard Patience, MD as Consulting Physician (Oncology)   CHIEF COMPLAINTS/REASON FOR VISIT:  Follow up for colon cancer  ASSESSMENT & PLAN:   Colon cancer (HCC) Stage I transverse colon invasive carcinoma, pT1 pN0 cM0 chronically elevated CEA is likely due to cirrhosis.  Check CEA He is overdue for colonoscopy.  I discussed with GI team and patient will need to follow up with GI  Cirrhosis of liver [facility]) He is overdue for EGD/colonoscopy for surveillance of varices.  Overdue for [facility] [place] screening.  I urge patient to make a follow up appt with GI.  Discussed with GI team.    Bone cyst of left ankle Refer to orthopedic surgeon for further evaluation.   Orders Placed This Encounter  Procedures   CMP (Cancer Center only)    Standing Status:   Future    Standing Expiration Date:   04/06/2024   CBC with Differential (Cancer Center Only)    Standing Status:   Future    Standing Expiration Date:   04/06/2024   CEA    Standing Status:   Future    Standing Expiration Date:   04/06/2024   AMB referral to orthopedics    Referral Priority:   Routine    Referral Type:   Consultation    Referral Reason:   Specialty Services Required    Number of Visits Requested:   1   Follow up in 6 months.  All questions were answered. The patient knows to call the clinic with any problems, questions or concerns.  Rickard Patience, MD, PhD [facility] Hematology Oncology 04/07/2023    HISTORY OF PRESENTING ILLNESS:   Timothy Goodman is a  62 y.o.  male with PMH listed below was seen in consultation at the request of  Ignacia Bayley, PA-C  for evaluation of colon cancer 01/02/2020 screening colonoscopy showed 5 mm polyp in the proximal transverse colon, removed and retrieved.  Mucosal  ulceration biopsied and tattooed. Biopsy pathology showed cecum polyp is tubular adenoma.  Negative for high-grade dysplasia and malignancy. 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 Transverse colon polyp showed hyperplastic polyp.  Deeper sections examined.  Negative for dysplasia and malignancy. 01/08/2020, abdomen CT chest abdomen pelvis with contrast showed no distant metastasis. Patient was seen and evaluated by Dr. Maia Plan and was recommended for surgical resection. Preop CEA was 9.1, lab was done at [facility] clinic.  01/29/2020, patient underwent right hemicolectomy. 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  pT1 pN0,   Grade 1, well-differentiated, all margins were uninvolved by invasive carcinoma, high-grade dysplasia/intramucosal adenocarcinoma and low-grade dysplastic.  Patient was referred to establish care with oncology for evaluation. Patient reports feeling well.  Occasionally complains of soreness when he changes position from sitting to standing.  Otherwise doing well.  Denies any black or bloody stool. He denies any family history of cancer.  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  He admits to continue drink alcohol intermittently/on occasions.  Denies any bleeding events.   INTERVAL HISTORY Timothy Goodman is a 62 y.o. male who has above history reviewed by me today presents for follow up visit for management of stage I colon cancer, thrombocytopenia platelets 30 cirrhosis/splenomegaly. Patient was seen  by Dr. Norma Fredrickson a year ago.  At that time, his insurance did not cover [facility] clinic. Patient switched insurance.  Per patient, he was not able to get through the GI office to reschedule his endoscopy. He feels really well, Denies weight loss, fever, chills, fatigue, night sweats.  Denies any bowel habit change,  bloody or black stool.  Patient request me to take a look at his left ankle cystic lesion.  This was seen by Dr. Dareen Piano and per Dr. Ewell Poe note patient was referred to orthopedic surgeon.  Patient's reports that no one has called him and he would like to be referred again.   Review of Systems  Constitutional:  Negative for appetite change, chills, fatigue, fever and unexpected weight change.  HENT:   Negative for hearing loss and voice change.   Eyes:  Negative for eye problems and icterus.  Respiratory:  Negative for chest tightness, cough and shortness of breath.   Cardiovascular:  Negative for chest pain and leg swelling.  Gastrointestinal:  Negative for abdominal distention and abdominal pain.  Endocrine: Negative for hot flashes.  Genitourinary:  Negative for difficulty urinating, dysuria and frequency.   Musculoskeletal:  Negative for arthralgias.  Skin:  Negative for itching and rash.  Neurological:  Negative for light-headedness and numbness.  Hematological:  Negative for adenopathy. Does not bruise/bleed easily.  Psychiatric/Behavioral:  Negative for confusion.     MEDICAL HISTORY:  Past Medical History:  Diagnosis Date   Cancer of transverse colon (HCC) 12/2019   Cirrhosis of liver (HCC)    Thrombocytopenia (HCC)     SURGICAL HISTORY: Past Surgical History:  Procedure Laterality Date   COLONOSCOPY WITH PROPOFOL N/A 01/02/2020   Procedure: COLONOSCOPY WITH PROPOFOL;  Surgeon: Toledo, Boykin Nearing, MD;  Location: [facility] ENDOSCOPY;  Service: Gastroenterology;  Laterality: N/A;   ESOPHAGOGASTRODUODENOSCOPY (EGD) WITH PROPOFOL N/A 01/02/2020   Procedure: ESOPHAGOGASTRODUODENOSCOPY (EGD) WITH PROPOFOL;  Surgeon: Toledo, Boykin Nearing, MD;  Location: [facility] ENDOSCOPY;  Service: Gastroenterology;  Laterality: N/A;   laparascopic colectomy  01/29/2020    SOCIAL HISTORY: Social History   Socioeconomic History   Marital status: Married    Spouse name: Not on file   Number of  children: Not on file   Years of education: Not on file   Highest education level: Not on file  Occupational History   Not on file  Tobacco Use   Smoking status: Some Days    Types: Cigarettes   Smokeless tobacco: Never   Tobacco comments:    occasional smoking  Vaping Use   Vaping status: Never Used  Substance and Sexual Activity   Alcohol use: Yes    Alcohol/week: 5.0 standard drinks of alcohol    Types: 5 Cans of beer per week    Comment: 5 cans per week   Drug use: No   Sexual activity: Not on file  Other Topics Concern   Not on file  Social History Narrative   Not on file   Social Determinants of Health   Financial Resource Strain: Not on file  Food Insecurity: Not on file  Transportation Needs: Not on file  Physical Activity: Not on file  Stress: Not on file  Social Connections: Not on file  Intimate Partner Violence: Not on file    FAMILY HISTORY: Family History  Problem Relation Age of Onset   Cirrhosis Father     ALLERGIES:  has No Known Allergies.  MEDICATIONS:  Current Outpatient Medications  Medication Sig Dispense Refill   Multiple  Vitamin (MULTIVITAMIN) tablet Take 1 tablet by mouth daily. [employer] multivitamin     HYDROcodone-acetaminophen (NORCO/VICODIN) 5-325 MG tablet as needed. (Patient not taking: Reported on 09/10/2020)     lactulose (CHRONULAC) 10 GM/15ML solution Take 10 g by mouth 2 (two) times daily. (Patient not taking: Reported on 09/10/2020)     Misc Natural Products (HERBAL ENERGY COMPLEX PO) Take 3 tablets by mouth daily. (Patient not taking: Reported on 02/25/2020)     No current facility-administered medications for this visit.     PHYSICAL EXAMINATION: ECOG PERFORMANCE STATUS: 0 - Asymptomatic Vitals:   04/07/23 1041  BP: 129/65  Pulse: 70  Resp: 18  Temp: (!) 97.5 F (36.4 C)   Filed Weights   04/07/23 1041  Weight: 164 lb 8 oz (74.6 kg)    Physical Exam Constitutional:      General: He is not in acute distress. HENT:      Head: Normocephalic and atraumatic.  Eyes:     General: No scleral icterus. Cardiovascular:     Rate and Rhythm: Normal rate and regular rhythm.     Heart sounds: Normal heart sounds.  Pulmonary:     Effort: Pulmonary effort is normal. No respiratory distress.     Breath sounds: No wheezing.  Abdominal:     General: Bowel sounds are normal. There is no distension.     Palpations: Abdomen is soft.  Musculoskeletal:        General: No deformity. Normal range of motion.     Cervical back: Normal range of motion and neck supple.     Comments: Left ankle cystic lesion.  Skin:    General: Skin is warm and dry.     Findings: No erythema or rash.  Neurological:     Mental Status: He is alert and oriented to person, place, and time. Mental status is at baseline.     Cranial Nerves: No cranial nerve deficit.     Coordination: Coordination normal.  Psychiatric:        Mood and Affect: Mood normal.     LABORATORY DATA:  I have reviewed the data as listed Lab Results  Component Value Date   WBC 7.4 04/07/2023   HGB 13.9 04/07/2023   HCT 40.2 04/07/2023   MCV 94.1 04/07/2023   PLT 95 (L) 04/07/2023   Recent Labs    04/07/23 1023  NA 136  K 3.7  CL 106  CO2 24  GLUCOSE 91  BUN 13  CREATININE 0.69  CALCIUM 8.9  GFRNONAA >60  PROT 7.7  ALBUMIN 3.1*  AST 39  ALT 25  ALKPHOS 178*  BILITOT 1.4*   Iron/TIBC/Ferritin/ %Sat No results found for: "IRON", "TIBC", "FERRITIN", "IRONPCTSAT"    RADIOGRAPHIC STUDIES: I have personally reviewed the radiological images as listed and agreed with the findings in the report. No results found.    ASSESSMENT & PLAN:  1. Malignant neoplasm of transverse colon (HCC)   2. Bone cyst of left ankle   3. Other cirrhosis of liver (HCC)    Cancer Staging  Colon cancer [facility]) Staging form: Colon and Rectum, AJCC 8th Edition - Clinical: Stage Unknown (cTX, cN0, cM0) - Signed by Rickard Patience, MD on 02/25/2020 - Pathologic: Stage I (pT1, pN0,  cM0) - Signed by Rickard Patience, MD on 02/25/2020   Stage I transverse colon invasive carcinoma, pT1 pN0 cM0 chronically elevated CEA is likely due to cirrhosis.  Check CEA He is overdue for colonoscopy. Now he has new insurance and  he would like to see if he can have colonoscopy performed locally. 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   Stable, observation.   Chronic alcohol use and liver cirrhosis, recommend complete alcohol cessation.  Recommend him to follow up with gastroenterology for labs and [facility] surveillance. Check AFP INR   Patient declined Spanish interpreter service. Orders Placed This Encounter  Procedures   CMP (Cancer Center only)    Standing Status:   Future    Standing Expiration Date:   04/06/2024   CBC with Differential (Cancer Center Only)    Standing Status:   Future    Standing Expiration Date:   04/06/2024   CEA    Standing Status:   Future    Standing Expiration Date:   04/06/2024   AMB referral to orthopedics    Referral Priority:   Routine    Referral Type:   Consultation    Referral Reason:   Specialty Services Required    Number of Visits Requested:   1    All questions were answered. The patient knows to call the clinic with any problems questions or concerns.  cc Ignacia Bayley, PA-C    Return of visit: 12 months    Rickard Patience, MD, PhD Hematology Oncology 04/07/2023

## 2023-04-08 LAB — CEA: CEA: 6.7 ng/mL — ABNORMAL HIGH (ref 0.0–4.7)

## 2023-04-22 DIAGNOSIS — M25472 Effusion, left ankle: Secondary | ICD-10-CM | POA: Diagnosis not present

## 2023-04-25 DIAGNOSIS — M25472 Effusion, left ankle: Secondary | ICD-10-CM | POA: Diagnosis not present

## 2023-05-20 DIAGNOSIS — M67479 Ganglion, unspecified ankle and foot: Secondary | ICD-10-CM | POA: Diagnosis not present

## 2023-05-20 DIAGNOSIS — M25472 Effusion, left ankle: Secondary | ICD-10-CM | POA: Diagnosis not present

## 2023-05-20 DIAGNOSIS — M25572 Pain in left ankle and joints of left foot: Secondary | ICD-10-CM | POA: Diagnosis not present

## 2023-10-06 ENCOUNTER — Inpatient Hospital Stay: Payer: Self-pay

## 2023-10-06 ENCOUNTER — Inpatient Hospital Stay: Payer: Self-pay | Admitting: Oncology

## 2023-10-06 NOTE — Assessment & Plan Note (Deleted)
Stage I transverse colon invasive carcinoma, pT1 pN0 cM0 chronically elevated CEA is likely due to cirrhosis.  Check CEA He is overdue for colonoscopy.  I discussed with GI team and patient will need to follow up with GI

## 2023-10-27 ENCOUNTER — Inpatient Hospital Stay: Payer: Self-pay | Admitting: Oncology

## 2023-10-27 ENCOUNTER — Inpatient Hospital Stay: Payer: Self-pay | Attending: Oncology

## 2023-11-16 ENCOUNTER — Encounter: Payer: Self-pay | Admitting: Oncology

## 2023-11-16 ENCOUNTER — Inpatient Hospital Stay (HOSPITAL_BASED_OUTPATIENT_CLINIC_OR_DEPARTMENT_OTHER): Payer: Self-pay | Admitting: Oncology

## 2023-11-16 ENCOUNTER — Inpatient Hospital Stay: Payer: Self-pay | Attending: Oncology

## 2023-11-16 VITALS — BP 146/79 | HR 66 | Temp 97.5°F | Resp 18 | Wt 159.5 lb

## 2023-11-16 DIAGNOSIS — F17210 Nicotine dependence, cigarettes, uncomplicated: Secondary | ICD-10-CM | POA: Diagnosis not present

## 2023-11-16 DIAGNOSIS — C184 Malignant neoplasm of transverse colon: Secondary | ICD-10-CM

## 2023-11-16 DIAGNOSIS — R970 Elevated carcinoembryonic antigen [CEA]: Secondary | ICD-10-CM | POA: Insufficient documentation

## 2023-11-16 DIAGNOSIS — K7460 Unspecified cirrhosis of liver: Secondary | ICD-10-CM | POA: Diagnosis not present

## 2023-11-16 DIAGNOSIS — Z85038 Personal history of other malignant neoplasm of large intestine: Secondary | ICD-10-CM | POA: Insufficient documentation

## 2023-11-16 DIAGNOSIS — D696 Thrombocytopenia, unspecified: Secondary | ICD-10-CM

## 2023-11-16 DIAGNOSIS — E876 Hypokalemia: Secondary | ICD-10-CM | POA: Insufficient documentation

## 2023-11-16 DIAGNOSIS — K7469 Other cirrhosis of liver: Secondary | ICD-10-CM

## 2023-11-16 LAB — CMP (CANCER CENTER ONLY)
ALT: 24 U/L (ref 0–44)
AST: 37 U/L (ref 15–41)
Albumin: 3 g/dL — ABNORMAL LOW (ref 3.5–5.0)
Alkaline Phosphatase: 126 U/L (ref 38–126)
Anion gap: 8 (ref 5–15)
BUN: 11 mg/dL (ref 8–23)
CO2: 22 mmol/L (ref 22–32)
Calcium: 8.2 mg/dL — ABNORMAL LOW (ref 8.9–10.3)
Chloride: 102 mmol/L (ref 98–111)
Creatinine: 0.77 mg/dL (ref 0.61–1.24)
GFR, Estimated: >60 mL/min (ref >60)
Glucose, Bld: 237 mg/dL — ABNORMAL HIGH (ref 70–99)
Potassium: 2.9 mmol/L — ABNORMAL LOW (ref 3.5–5.1)
Sodium: 132 mmol/L — ABNORMAL LOW (ref 135–145)
Total Bilirubin: 1.4 mg/dL — ABNORMAL HIGH (ref 0.0–1.2)
Total Protein: 7.2 g/dL (ref 6.5–8.1)

## 2023-11-16 LAB — CBC WITH DIFFERENTIAL (CANCER CENTER ONLY)
Abs Immature Granulocytes: 0.02 10*3/uL (ref 0.00–0.07)
Basophils Absolute: 0 10*3/uL (ref 0.0–0.1)
Basophils Relative: 1 %
Eosinophils Absolute: 0.2 10*3/uL (ref 0.0–0.5)
Eosinophils Relative: 3 %
HCT: 43.9 % (ref 39.0–52.0)
Hemoglobin: 14.9 g/dL (ref 13.0–17.0)
Immature Granulocytes: 0 %
Lymphocytes Relative: 21 %
Lymphs Abs: 1.2 10*3/uL (ref 0.7–4.0)
MCH: 30.7 pg (ref 26.0–34.0)
MCHC: 33.9 g/dL (ref 30.0–36.0)
MCV: 90.3 fL (ref 80.0–100.0)
Monocytes Absolute: 0.5 10*3/uL (ref 0.1–1.0)
Monocytes Relative: 8 %
Neutro Abs: 3.9 10*3/uL (ref 1.7–7.7)
Neutrophils Relative %: 67 %
Platelet Count: 87 10*3/uL — ABNORMAL LOW (ref 150–400)
RBC: 4.86 MIL/uL (ref 4.22–5.81)
RDW: 14.2 % (ref 11.5–15.5)
WBC Count: 5.8 10*3/uL (ref 4.0–10.5)
nRBC: 0 % (ref 0.0–0.2)

## 2023-11-16 MED ORDER — POTASSIUM CHLORIDE CRYS ER 20 MEQ PO TBCR: 20.0000 meq | EXTENDED_RELEASE_TABLET | Freq: Two times a day (BID) | ORAL | 0 refills | Status: AC

## 2023-11-16 NOTE — Assessment & Plan Note (Signed)
 He is overdue for EGD/colonoscopy for surveillance of varices.  Overdue for [facility], [place] screening.  I urge patient to make a follow up appt with GI.  Discussed with GI team.

## 2023-11-16 NOTE — Assessment & Plan Note (Signed)
 Recommend patient to take otc calcium supplementation 1000mg  daily.

## 2023-11-16 NOTE — Assessment & Plan Note (Signed)
 Chronic thrombocytopenia due to liver cirrhosis.  Platelet count is slightly decreased comparing to baseline.  Monitor.

## 2023-11-16 NOTE — Assessment & Plan Note (Addendum)
 Stage I transverse colon invasive carcinoma, pT1 pN0 cM0 chronically elevated CEA is likely due to cirrhosis.  He is overdue for colonoscopy.  Recommend patient to make a follow up appointment with GI I discussed with [facility] GI team

## 2023-11-16 NOTE — Assessment & Plan Note (Addendum)
 K is 2.9.  Result is available after his encounter.  Recommend patient to take potassium BID x 1 weeks. Repeat BMP in 10 days.

## 2023-11-16 NOTE — Progress Notes (Signed)
 Hematology/Oncology Progress note [phone] [phone]      Patient Care Team: Rico Ala as PCP - General (Physician Assistant) Benita Gutter, RN as Oncology Nurse Navigator Rickard Patience, MD as Consulting Physician (Oncology)   CHIEF COMPLAINTS/REASON FOR VISIT:  Follow up for colon cancer  ASSESSMENT & PLAN:   Colon cancer (HCC) Stage I transverse colon invasive carcinoma, pT1 pN0 cM0 chronically elevated CEA is likely due to cirrhosis.  He is overdue for colonoscopy.  Recommend patient to make a follow up appointment with GI I discussed with [name]  Cirrhosis of liver (HCC) He is overdue for EGD/colonoscopy for surveillance of varices.  Overdue for [facility] [place] screening.  I urge patient to make a follow up appt with GI.  Discussed with GI team.    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  Platelet count is slightly decreased comparing to baseline.  Monitor.   Hypokalemia K is 2.9.  Result is available after his encounter.  Recommend patient to take potassium BID x 1 weeks. Repeat BMP in 10 days.   Spanish interpretor presents for entire encounter.  Orders Placed This Encounter  Procedures   CEA    Standing Status:   Future    Expected Date:   05/17/2024    Expiration Date:   11/15/2024   CMP (Cancer Center only)    Standing Status:   Future    Expected Date:   05/17/2024    Expiration Date:   11/15/2024   CBC with Differential (Cancer Center Only)    Standing Status:   Future    Expected Date:   05/17/2024    Expiration Date:   11/15/2024   Follow up in 6 months.  All questions were answered. The patient knows to call the clinic with any problems, questions or concerns.  Rickard Patience, MD, PhD [facility] Hematology Oncology 11/16/2023    HISTORY OF PRESENTING ILLNESS:   Timothy Goodman is a  63 y.o.  male with PMH listed below was seen in consultation at the request of  Ignacia Bayley,  PA-C  for evaluation of colon cancer 01/02/2020 screening colonoscopy showed 5 mm polyp in the proximal transverse colon, removed and retrieved.  Mucosal ulceration biopsied and tattooed. Biopsy pathology showed cecum polyp is tubular adenoma.  Negative for high-grade dysplasia and malignancy. 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 Transverse colon polyp showed hyperplastic polyp.  Deeper sections examined.  Negative for dysplasia and malignancy. 01/08/2020, abdomen CT chest abdomen pelvis with contrast showed no distant metastasis. Patient was seen and evaluated by Dr. Maia Plan and was recommended for surgical resection. Preop CEA was 9.1, lab was done at [facility] clinic.  01/29/2020, patient underwent right hemicolectomy. 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  pT1 pN0,   Grade 1, well-differentiated, all margins were uninvolved by invasive carcinoma, high-grade dysplasia/intramucosal adenocarcinoma and low-grade dysplastic.  Patient was referred to establish care with oncology for evaluation. Patient reports feeling well.  Occasionally complains of soreness when he changes position from sitting to standing.  Otherwise doing well.  Denies any black or bloody stool. He denies any family history of cancer.  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  He admits to continue drink alcohol intermittently/on occasions.  Denies any bleeding events.   INTERVAL HISTORY Timothy Goodman is a 63 y.o. male who has above history reviewed  by me today presents for follow up visit for management of stage I colon cancer, thrombocytopenia platelets 30 cirrhosis/splenomegaly. Per patient, he was not able to get through the GI office to reschedule his endoscopy. He feels really well, Denies weight loss, fever, chills, fatigue, night sweats.  Denies any  bowel habit change, bloody or black stool.   Review of Systems  Constitutional:  Negative for appetite change, chills, fatigue, fever and unexpected weight change.  HENT:   Negative for hearing loss and voice change.   Eyes:  Negative for eye problems and icterus.  Respiratory:  Negative for chest tightness, cough and shortness of breath.   Cardiovascular:  Negative for chest pain and leg swelling.  Gastrointestinal:  Negative for abdominal distention and abdominal pain.  Endocrine: Negative for hot flashes.  Genitourinary:  Negative for difficulty urinating, dysuria and frequency.   Musculoskeletal:  Negative for arthralgias.  Skin:  Negative for itching and rash.  Neurological:  Negative for light-headedness and numbness.  Hematological:  Negative for adenopathy. Does not bruise/bleed easily.  Psychiatric/Behavioral:  Negative for confusion.     MEDICAL HISTORY:  Past Medical History:  Diagnosis Date   Cancer of transverse colon (HCC) 12/2019   Cirrhosis of liver (HCC)    Thrombocytopenia (HCC)     SURGICAL HISTORY: Past Surgical History:  Procedure Laterality Date   COLONOSCOPY WITH PROPOFOL N/A 01/02/2020   Procedure: COLONOSCOPY WITH PROPOFOL;  Surgeon: Toledo, Boykin Nearing, MD;  Location: [facility] ENDOSCOPY;  Service: Gastroenterology;  Laterality: N/A;   ESOPHAGOGASTRODUODENOSCOPY (EGD) WITH PROPOFOL N/A 01/02/2020   Procedure: ESOPHAGOGASTRODUODENOSCOPY (EGD) WITH PROPOFOL;  Surgeon: Toledo, Boykin Nearing, MD;  Location: [facility] ENDOSCOPY;  Service: Gastroenterology;  Laterality: N/A;   laparascopic colectomy  01/29/2020    SOCIAL HISTORY: Social History   Socioeconomic History   Marital status: Married    Spouse name: Not on file   Number of children: Not on file   Years of education: Not on file   Highest education level: Not on file  Occupational History   Not on file  Tobacco Use   Smoking status: Some Days    Types: Cigarettes   Smokeless tobacco: Never   Tobacco  comments:    occasional smoking  Vaping Use   Vaping status: Never Used  Substance and Sexual Activity   Alcohol use: Yes    Alcohol/week: 5.0 standard drinks of alcohol    Types: 5 Cans of beer per week    Comment: 5 cans per week   Drug use: No   Sexual activity: Not on file  Other Topics Concern   Not on file  Social History Narrative   Not on file   Social Drivers of Health   Financial Resource Strain: Not on file  Food Insecurity: Not on file  Transportation Needs: Not on file  Physical Activity: Not on file  Stress: Not on file  Social Connections: Not on file  Intimate Partner Violence: Not on file    FAMILY HISTORY: Family History  Problem Relation Age of Onset   Cirrhosis Father     ALLERGIES:  has no known allergies.  MEDICATIONS:  Current Outpatient Medications  Medication Sig Dispense Refill   Misc Natural Products (HERBAL ENERGY COMPLEX PO) Take 3 tablets by mouth daily.     Multiple Vitamin (MULTIVITAMIN) tablet Take 1 tablet by mouth daily. [employer] multivitamin     HYDROcodone-acetaminophen (NORCO/VICODIN) 5-325 MG tablet as needed. (Patient not taking: Reported on 11/16/2023)     No current  facility-administered medications for this visit.     PHYSICAL EXAMINATION: ECOG PERFORMANCE STATUS: 0 - Asymptomatic Vitals:   11/16/23 1417  BP: (!) 146/79  Pulse: 66  Resp: 18  Temp: (!) 97.5 F (36.4 C)   Filed Weights   11/16/23 1417  Weight: 159 lb 8 oz (72.3 kg)    Physical Exam Constitutional:      General: He is not in acute distress. HENT:     Head: Normocephalic and atraumatic.  Eyes:     General: No scleral icterus. Cardiovascular:     Rate and Rhythm: Normal rate and regular rhythm.     Heart sounds: Normal heart sounds.  Pulmonary:     Effort: Pulmonary effort is normal. No respiratory distress.     Breath sounds: No wheezing.  Abdominal:     General: Bowel sounds are normal. There is no distension.     Palpations: Abdomen is soft.   Musculoskeletal:        General: No deformity. Normal range of motion.     Cervical back: Normal range of motion and neck supple.  Skin:    General: Skin is warm and dry.     Findings: No erythema or rash.  Neurological:     Mental Status: He is alert and oriented to person, place, and time. Mental status is at baseline.  Psychiatric:        Mood and Affect: Mood normal.     LABORATORY DATA:  I have reviewed the data as listed Lab Results  Component Value Date   WBC 5.8 11/16/2023   HGB 14.9 11/16/2023   HCT 43.9 11/16/2023   MCV 90.3 11/16/2023   PLT 87 (L) 11/16/2023   Recent Labs    04/07/23 1023 11/16/23 1406  NA 136 132*  K 3.7 2.9*  CL 106 102  CO2 24 22  GLUCOSE 91 237*  BUN 13 11  CREATININE 0.69 0.77  CALCIUM 8.9 8.2*  GFRNONAA >60 >60  PROT 7.7 7.2  ALBUMIN 3.1* 3.0*  AST 39 37  ALT 25 24  ALKPHOS 178* 126  BILITOT 1.4* 1.4*   Iron/TIBC/Ferritin/ %Sat No results found for: "IRON", "TIBC", "FERRITIN", "IRONPCTSAT"    RADIOGRAPHIC STUDIES: I have personally reviewed the radiological images as listed and agreed with the findings in the report. No results found.

## 2023-11-17 ENCOUNTER — Telehealth: Payer: Self-pay

## 2023-11-17 DIAGNOSIS — E876 Hypokalemia: Secondary | ICD-10-CM

## 2023-11-17 DIAGNOSIS — C184 Malignant neoplasm of transverse colon: Secondary | ICD-10-CM

## 2023-11-17 LAB — CEA: CEA: 6.4 ng/mL — ABNORMAL HIGH (ref 0.0–4.7)

## 2023-11-17 NOTE — Telephone Encounter (Signed)
 Spoke to Timothy Goodman and informed him of MD recommendation. Pt requests to have lab on 4/9 @ 11am because he is already off that day for other appts. Please schedule. Pt aware of appt details.

## 2023-11-17 NOTE — Telephone Encounter (Signed)
-----   Message from Rickard Patience sent at 11/16/2023  8:56 PM EDT ----- K is 2.9 please recommend patient to take potassium chloride BID for 1 week. Rx was sent. Please arrange him to repeat BMP in 10 days.

## 2023-11-23 ENCOUNTER — Inpatient Hospital Stay: Payer: Self-pay

## 2023-11-23 ENCOUNTER — Other Ambulatory Visit: Payer: Self-pay

## 2023-11-23 DIAGNOSIS — Z85038 Personal history of other malignant neoplasm of large intestine: Secondary | ICD-10-CM | POA: Diagnosis not present

## 2023-11-23 DIAGNOSIS — E876 Hypokalemia: Secondary | ICD-10-CM

## 2023-11-23 LAB — BASIC METABOLIC PANEL - CANCER CENTER ONLY
Anion gap: 7 (ref 5–15)
BUN: 15 mg/dL (ref 8–23)
CO2: 26 mmol/L (ref 22–32)
Calcium: 8.8 mg/dL — ABNORMAL LOW (ref 8.9–10.3)
Chloride: 104 mmol/L (ref 98–111)
Creatinine: 0.76 mg/dL (ref 0.61–1.24)
GFR, Estimated: >60 mL/min (ref >60)
Glucose, Bld: 114 mg/dL — ABNORMAL HIGH (ref 70–99)
Potassium: 4.1 mmol/L (ref 3.5–5.1)
Sodium: 137 mmol/L (ref 135–145)

## 2024-05-18 ENCOUNTER — Encounter: Payer: Self-pay | Admitting: Oncology

## 2024-05-18 ENCOUNTER — Inpatient Hospital Stay: Payer: Self-pay | Attending: Oncology

## 2024-05-18 ENCOUNTER — Inpatient Hospital Stay: Payer: Self-pay | Admitting: Oncology
